# Patient Record
Sex: Female | Born: 1970 | Race: Black or African American | Hispanic: No | Marital: Single | Smoking: Former smoker
Health system: Southern US, Community
[De-identification: ages and names within clinical notes are randomized; demographics above are authoritative.]

## PROBLEM LIST (undated history)

## (undated) DIAGNOSIS — Z862 Personal history of diseases of the blood and blood-forming organs and certain disorders involving the immune mechanism: Secondary | ICD-10-CM

## (undated) DIAGNOSIS — N186 End stage renal disease: Secondary | ICD-10-CM

## (undated) DIAGNOSIS — E119 Type 2 diabetes mellitus without complications: Secondary | ICD-10-CM

## (undated) DIAGNOSIS — Z930 Tracheostomy status: Secondary | ICD-10-CM

## (undated) DIAGNOSIS — N189 Chronic kidney disease, unspecified: Secondary | ICD-10-CM

## (undated) DIAGNOSIS — I10 Essential (primary) hypertension: Secondary | ICD-10-CM

## (undated) HISTORY — PX: TRACHEOSTOMY: SUR1362

## (undated) HISTORY — PX: CHOLECYSTECTOMY: SHX55

## (undated) HISTORY — DX: Chronic kidney disease, unspecified: N18.9

## (undated) HISTORY — PX: COLONOSCOPY: SHX174

## (undated) HISTORY — PX: AV FISTULA PLACEMENT: SHX1204

## (undated) HISTORY — PX: ABDOMINAL HYSTERECTOMY: SHX81

---

## 1998-07-11 DIAGNOSIS — Z8541 Personal history of malignant neoplasm of cervix uteri: Secondary | ICD-10-CM

## 1998-07-11 HISTORY — DX: Personal history of malignant neoplasm of cervix uteri: Z85.41

## 2020-06-21 ENCOUNTER — Encounter (HOSPITAL_COMMUNITY): Payer: Self-pay | Admitting: Internal Medicine

## 2020-06-21 ENCOUNTER — Other Ambulatory Visit: Payer: Self-pay

## 2020-06-21 ENCOUNTER — Emergency Department (HOSPITAL_COMMUNITY): Payer: Medicaid Other

## 2020-06-21 ENCOUNTER — Inpatient Hospital Stay (HOSPITAL_COMMUNITY)
Admission: EM | Admit: 2020-06-21 | Discharge: 2020-06-24 | DRG: 640 | Disposition: A | Discharge status: Home / Self Care | Payer: Medicaid Other | Attending: Internal Medicine | Admitting: Internal Medicine

## 2020-06-21 DIAGNOSIS — J9601 Acute respiratory failure with hypoxia: Secondary | ICD-10-CM | POA: Diagnosis present

## 2020-06-21 DIAGNOSIS — E1122 Type 2 diabetes mellitus with diabetic chronic kidney disease: Secondary | ICD-10-CM | POA: Diagnosis present

## 2020-06-21 DIAGNOSIS — Z930 Tracheostomy status: Secondary | ICD-10-CM

## 2020-06-21 DIAGNOSIS — R0602 Shortness of breath: Secondary | ICD-10-CM

## 2020-06-21 DIAGNOSIS — Z992 Dependence on renal dialysis: Secondary | ICD-10-CM

## 2020-06-21 DIAGNOSIS — I120 Hypertensive chronic kidney disease with stage 5 chronic kidney disease or end stage renal disease: Secondary | ICD-10-CM | POA: Diagnosis present

## 2020-06-21 DIAGNOSIS — E872 Acidosis: Secondary | ICD-10-CM | POA: Diagnosis present

## 2020-06-21 DIAGNOSIS — N2581 Secondary hyperparathyroidism of renal origin: Secondary | ICD-10-CM | POA: Diagnosis present

## 2020-06-21 DIAGNOSIS — Z6841 Body Mass Index (BMI) 40.0 and over, adult: Secondary | ICD-10-CM

## 2020-06-21 DIAGNOSIS — Z87891 Personal history of nicotine dependence: Secondary | ICD-10-CM

## 2020-06-21 DIAGNOSIS — E8779 Other fluid overload: Secondary | ICD-10-CM

## 2020-06-21 DIAGNOSIS — Z20822 Contact with and (suspected) exposure to covid-19: Secondary | ICD-10-CM | POA: Diagnosis present

## 2020-06-21 DIAGNOSIS — Z8541 Personal history of malignant neoplasm of cervix uteri: Secondary | ICD-10-CM

## 2020-06-21 DIAGNOSIS — D631 Anemia in chronic kidney disease: Secondary | ICD-10-CM | POA: Diagnosis present

## 2020-06-21 DIAGNOSIS — N186 End stage renal disease: Secondary | ICD-10-CM | POA: Diagnosis not present

## 2020-06-21 DIAGNOSIS — I160 Hypertensive urgency: Secondary | ICD-10-CM | POA: Diagnosis present

## 2020-06-21 DIAGNOSIS — Z794 Long term (current) use of insulin: Secondary | ICD-10-CM

## 2020-06-21 DIAGNOSIS — Z833 Family history of diabetes mellitus: Secondary | ICD-10-CM

## 2020-06-21 DIAGNOSIS — Z5900 Homelessness unspecified: Secondary | ICD-10-CM

## 2020-06-21 DIAGNOSIS — I10 Essential (primary) hypertension: Secondary | ICD-10-CM | POA: Diagnosis present

## 2020-06-21 DIAGNOSIS — D6959 Other secondary thrombocytopenia: Secondary | ICD-10-CM | POA: Diagnosis present

## 2020-06-21 DIAGNOSIS — Z90710 Acquired absence of both cervix and uterus: Secondary | ICD-10-CM

## 2020-06-21 DIAGNOSIS — K580 Irritable bowel syndrome with diarrhea: Secondary | ICD-10-CM | POA: Diagnosis present

## 2020-06-21 DIAGNOSIS — E875 Hyperkalemia: Secondary | ICD-10-CM | POA: Diagnosis present

## 2020-06-21 DIAGNOSIS — E8770 Fluid overload, unspecified: Secondary | ICD-10-CM | POA: Diagnosis present

## 2020-06-21 DIAGNOSIS — Z9115 Patient's noncompliance with renal dialysis: Secondary | ICD-10-CM | POA: Diagnosis not present

## 2020-06-21 DIAGNOSIS — E119 Type 2 diabetes mellitus without complications: Secondary | ICD-10-CM

## 2020-06-21 DIAGNOSIS — Z91158 Patient's noncompliance with renal dialysis for other reason: Secondary | ICD-10-CM

## 2020-06-21 DIAGNOSIS — E1165 Type 2 diabetes mellitus with hyperglycemia: Secondary | ICD-10-CM | POA: Diagnosis present

## 2020-06-21 DIAGNOSIS — E785 Hyperlipidemia, unspecified: Secondary | ICD-10-CM | POA: Diagnosis present

## 2020-06-21 HISTORY — DX: End stage renal disease: N18.6

## 2020-06-21 HISTORY — DX: Type 2 diabetes mellitus without complications: E11.9

## 2020-06-21 HISTORY — DX: Personal history of diseases of the blood and blood-forming organs and certain disorders involving the immune mechanism: Z86.2

## 2020-06-21 HISTORY — DX: Tracheostomy status: Z93.0

## 2020-06-21 HISTORY — DX: Essential (primary) hypertension: I10

## 2020-06-21 LAB — CBC WITH DIFFERENTIAL/PLATELET
Abs Immature Granulocytes: 0.04 10*3/uL (ref 0.00–0.07)
Basophils Absolute: 0 10*3/uL (ref 0.0–0.1)
Basophils Relative: 1 %
Eosinophils Absolute: 0.4 10*3/uL (ref 0.0–0.5)
Eosinophils Relative: 6 %
HCT: 25.7 % — ABNORMAL LOW (ref 36.0–46.0)
Hemoglobin: 8 g/dL — ABNORMAL LOW (ref 12.0–15.0)
Immature Granulocytes: 1 %
Lymphocytes Relative: 24 %
Lymphs Abs: 1.4 10*3/uL (ref 0.7–4.0)
MCH: 28.3 pg (ref 26.0–34.0)
MCHC: 31.1 g/dL (ref 30.0–36.0)
MCV: 90.8 fL (ref 80.0–100.0)
Monocytes Absolute: 0.3 10*3/uL (ref 0.1–1.0)
Monocytes Relative: 6 %
Neutro Abs: 3.8 10*3/uL (ref 1.7–7.7)
Neutrophils Relative %: 62 %
Platelets: 102 10*3/uL — ABNORMAL LOW (ref 150–400)
RBC: 2.83 MIL/uL — ABNORMAL LOW (ref 3.87–5.11)
RDW: 16.4 % — ABNORMAL HIGH (ref 11.5–15.5)
WBC: 6 10*3/uL (ref 4.0–10.5)
nRBC: 0 % (ref 0.0–0.2)

## 2020-06-21 LAB — HEMOGLOBIN A1C
Hgb A1c MFr Bld: 10 % — ABNORMAL HIGH (ref 4.8–5.6)
Mean Plasma Glucose: 240.3 mg/dL

## 2020-06-21 LAB — BASIC METABOLIC PANEL
Anion gap: 18 — ABNORMAL HIGH (ref 5–15)
BUN: 102 mg/dL — ABNORMAL HIGH (ref 6–20)
CO2: 16 mmol/L — ABNORMAL LOW (ref 22–32)
Calcium: 6.7 mg/dL — ABNORMAL LOW (ref 8.9–10.3)
Chloride: 102 mmol/L (ref 98–111)
Creatinine, Ser: 15.29 mg/dL — ABNORMAL HIGH (ref 0.44–1.00)
GFR, Estimated: 3 mL/min — ABNORMAL LOW (ref >60)
Glucose, Bld: 206 mg/dL — ABNORMAL HIGH (ref 70–99)
Potassium: 5.9 mmol/L — ABNORMAL HIGH (ref 3.5–5.1)
Sodium: 136 mmol/L (ref 135–145)

## 2020-06-21 LAB — GLUCOSE, CAPILLARY
Glucose-Capillary: 148 mg/dL — ABNORMAL HIGH (ref 70–99)
Glucose-Capillary: 183 mg/dL — ABNORMAL HIGH (ref 70–99)

## 2020-06-21 LAB — CBC
HCT: 25.9 % — ABNORMAL LOW (ref 36.0–46.0)
Hemoglobin: 7.9 g/dL — ABNORMAL LOW (ref 12.0–15.0)
MCH: 27.8 pg (ref 26.0–34.0)
MCHC: 30.5 g/dL (ref 30.0–36.0)
MCV: 91.2 fL (ref 80.0–100.0)
Platelets: 99 10*3/uL — ABNORMAL LOW (ref 150–400)
RBC: 2.84 MIL/uL — ABNORMAL LOW (ref 3.87–5.11)
RDW: 16.6 % — ABNORMAL HIGH (ref 11.5–15.5)
WBC: 6.3 10*3/uL (ref 4.0–10.5)
nRBC: 0 % (ref 0.0–0.2)

## 2020-06-21 LAB — MRSA PCR SCREENING: MRSA by PCR: POSITIVE — AB

## 2020-06-21 LAB — RESP PANEL BY RT-PCR (FLU A&B, COVID) ARPGX2
Influenza A by PCR: NEGATIVE
Influenza B by PCR: NEGATIVE
SARS Coronavirus 2 by RT PCR: NEGATIVE

## 2020-06-21 LAB — MAGNESIUM: Magnesium: 2.2 mg/dL (ref 1.7–2.4)

## 2020-06-21 LAB — CBG MONITORING, ED
Glucose-Capillary: 156 mg/dL — ABNORMAL HIGH (ref 70–99)
Glucose-Capillary: 158 mg/dL — ABNORMAL HIGH (ref 70–99)

## 2020-06-21 LAB — TROPONIN I (HIGH SENSITIVITY)
Troponin I (High Sensitivity): 17 ng/L (ref <18)
Troponin I (High Sensitivity): 17 ng/L (ref <18)

## 2020-06-21 MED ORDER — LIDOCAINE HCL (PF) 1 % IJ SOLN: 5.0000 mL | INTRAMUSCULAR | Status: DC | PRN

## 2020-06-21 MED ORDER — PENTAFLUOROPROP-TETRAFLUOROETH EX AERO: 1.0000 "application " | INHALATION_SPRAY | CUTANEOUS | Status: DC | PRN

## 2020-06-21 MED ORDER — ACETAMINOPHEN 325 MG PO TABS
650.0000 mg | ORAL_TABLET | Freq: Four times a day (QID) | ORAL | Status: DC | PRN
  Administered 2020-06-22 (×2): 650 mg via ORAL
  Filled 2020-06-21 (×2): qty 2

## 2020-06-21 MED ORDER — HYDRALAZINE HCL 25 MG PO TABS
25.0000 mg | ORAL_TABLET | Freq: Three times a day (TID) | ORAL | Status: DC
  Administered 2020-06-21 – 2020-06-24 (×7): 25 mg via ORAL
  Filled 2020-06-21 (×10): qty 1

## 2020-06-21 MED ORDER — CINACALCET HCL 30 MG PO TABS: 30.0000 mg | ORAL_TABLET | Freq: Every day | ORAL | Status: DC

## 2020-06-21 MED ORDER — HYDRALAZINE HCL 20 MG/ML IJ SOLN
10.0000 mg | Freq: Once | INTRAMUSCULAR | Status: AC
  Administered 2020-06-21: 03:00:00 10 mg via INTRAVENOUS
  Filled 2020-06-21: qty 1

## 2020-06-21 MED ORDER — SEVELAMER CARBONATE 800 MG PO TABS
2400.0000 mg | ORAL_TABLET | Freq: Three times a day (TID) | ORAL | Status: DC
  Administered 2020-06-21 – 2020-06-24 (×10): 2400 mg via ORAL
  Filled 2020-06-21 (×11): qty 3

## 2020-06-21 MED ORDER — CHLORHEXIDINE GLUCONATE CLOTH 2 % EX PADS
6.0000 | MEDICATED_PAD | Freq: Every day | CUTANEOUS | Status: DC
  Administered 2020-06-21 – 2020-06-24 (×3): 6 via TOPICAL

## 2020-06-21 MED ORDER — AMLODIPINE BESYLATE 10 MG PO TABS
10.0000 mg | ORAL_TABLET | Freq: Every day | ORAL | Status: DC
  Administered 2020-06-22 – 2020-06-24 (×3): 10 mg via ORAL
  Filled 2020-06-21 (×3): qty 1

## 2020-06-21 MED ORDER — HEPARIN SODIUM (PORCINE) 1000 UNIT/ML DIALYSIS: 1000.0000 [IU] | INTRAMUSCULAR | Status: DC | PRN

## 2020-06-21 MED ORDER — ACETAMINOPHEN 650 MG RE SUPP: 650.0000 mg | Freq: Four times a day (QID) | RECTAL | Status: DC | PRN

## 2020-06-21 MED ORDER — LABETALOL HCL 200 MG PO TABS
300.0000 mg | ORAL_TABLET | Freq: Two times a day (BID) | ORAL | Status: DC
  Administered 2020-06-22 – 2020-06-24 (×6): 300 mg via ORAL
  Filled 2020-06-21 (×6): qty 1

## 2020-06-21 MED ORDER — INSULIN DETEMIR 100 UNIT/ML ~~LOC~~ SOLN
15.0000 [IU] | Freq: Every day | SUBCUTANEOUS | Status: DC
  Administered 2020-06-22 – 2020-06-24 (×4): 15 [IU] via SUBCUTANEOUS
  Filled 2020-06-21 (×4): qty 0.15

## 2020-06-21 MED ORDER — SODIUM CHLORIDE 0.9 % IV SOLN: 100.0000 mL | INTRAVENOUS | Status: DC | PRN

## 2020-06-21 MED ORDER — SENNOSIDES-DOCUSATE SODIUM 8.6-50 MG PO TABS: 1.0000 | ORAL_TABLET | Freq: Every evening | ORAL | Status: DC | PRN

## 2020-06-21 MED ORDER — LIDOCAINE-PRILOCAINE 2.5-2.5 % EX CREA: 1.0000 "application " | TOPICAL_CREAM | CUTANEOUS | Status: DC | PRN

## 2020-06-21 MED ORDER — ONDANSETRON HCL 4 MG PO TABS
4.0000 mg | ORAL_TABLET | Freq: Four times a day (QID) | ORAL | Status: DC | PRN
  Administered 2020-06-22: 4 mg via ORAL
  Filled 2020-06-21: qty 1

## 2020-06-21 MED ORDER — INSULIN ASPART 100 UNIT/ML ~~LOC~~ SOLN
0.0000 [IU] | Freq: Three times a day (TID) | SUBCUTANEOUS | Status: DC
  Administered 2020-06-21: 1 [IU] via SUBCUTANEOUS
  Administered 2020-06-21: 09:00:00 2 [IU] via SUBCUTANEOUS
  Administered 2020-06-22 (×2): 1 [IU] via SUBCUTANEOUS
  Administered 2020-06-22 – 2020-06-23 (×2): 2 [IU] via SUBCUTANEOUS
  Administered 2020-06-24: 3 [IU] via SUBCUTANEOUS
  Administered 2020-06-24: 2 [IU] via SUBCUTANEOUS
  Administered 2020-06-24: 3 [IU] via SUBCUTANEOUS

## 2020-06-21 MED ORDER — SODIUM ZIRCONIUM CYCLOSILICATE 10 G PO PACK
10.0000 g | PACK | Freq: Once | ORAL | Status: AC
  Administered 2020-06-21: 05:00:00 10 g via ORAL
  Filled 2020-06-21: qty 1

## 2020-06-21 MED ORDER — HEPARIN SODIUM (PORCINE) 5000 UNIT/ML IJ SOLN
5000.0000 [IU] | Freq: Three times a day (TID) | INTRAMUSCULAR | Status: DC
  Administered 2020-06-21 – 2020-06-24 (×10): 5000 [IU] via SUBCUTANEOUS
  Filled 2020-06-21 (×10): qty 1

## 2020-06-21 MED ORDER — FENTANYL CITRATE (PF) 100 MCG/2ML IJ SOLN
50.0000 ug | Freq: Once | INTRAMUSCULAR | Status: AC
  Administered 2020-06-21: 02:00:00 50 ug via INTRAVENOUS
  Filled 2020-06-21: qty 2

## 2020-06-21 MED ORDER — ROSUVASTATIN CALCIUM 5 MG PO TABS
5.0000 mg | ORAL_TABLET | Freq: Every day | ORAL | Status: DC
  Administered 2020-06-22 – 2020-06-24 (×4): 5 mg via ORAL
  Filled 2020-06-21 (×4): qty 1

## 2020-06-21 MED ORDER — ONDANSETRON HCL 4 MG/2ML IJ SOLN: 4.0000 mg | Freq: Four times a day (QID) | INTRAMUSCULAR | Status: DC | PRN

## 2020-06-21 MED ORDER — ALTEPLASE 2 MG IJ SOLR: 2.0000 mg | Freq: Once | INTRAMUSCULAR | Status: DC | PRN

## 2020-06-21 MED ORDER — HYDRALAZINE HCL 20 MG/ML IJ SOLN
10.0000 mg | Freq: Once | INTRAMUSCULAR | Status: AC
  Administered 2020-06-21: 04:00:00 10 mg via INTRAVENOUS
  Filled 2020-06-21: qty 1

## 2020-06-21 NOTE — H&P (Addendum)
History and Physical    Pamela Rogers ZOX:[id number] DOB: [birth year] DOA: 06/21/2020  PCP: Patient, No Pcp Per  Patient coming from: Home  I have personally briefly reviewed patient's old medical records in [place]  Chief Complaint: SOB  HPI: Pamela Rogers is a 49 y.o. 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  Patient presents to ED with multiple missed sessions of dialysis.  Last dialyzed earlier this month, states unable to go since then due to lack of transportation.  Pt having progressively worsening SOB over past week and a half since last dialysis.  Dry cough.  In town visiting daughter in [place].  Looks like she has history of missing dialysis sessions somewhat chronically: recently admitted to [facility] a mere 10 days ago (12/12-12/14) for missed dialysis, HCAP due to pan-sensitive pseudomonas.  Hospitalist at [facility] notes that shes had 11 admissions to [facility] since 02/2019 for missed dialysis sessions.  Pt denies fevers, chills, CP.   ED Course: initial BP 219/119, RR 30, HGB 8.0, Creat 15 BUN 102, calcium 6.7.  CXR shows vascular congestion, and cardiomegally.   Review of Systems: As per HPI, otherwise all review of systems negative.  Past Medical History:  Diagnosis Date  . DM2 (diabetes mellitus, type 2) ([place])   . ESRD (end stage renal disease) ([place])    dialysis MWF  . History of anemia due to CKD   . History of cervical cancer 2000  . HTN (hypertension)   . Tracheostomy in place [facility])    Chronic    Past Surgical History:  Procedure Laterality Date  . ABDOMINAL HYSTERECTOMY    . AV FISTULA PLACEMENT    . CHOLECYSTECTOMY    . COLONOSCOPY    . TRACHEOSTOMY       reports that she has quit smoking. Her smoking use included cigarettes. She has a 31.00 pack-year smoking history. She does not have any smokeless tobacco history on file. She reports previous alcohol use. She reports that she does not  use drugs.  Allergies  Allergen Reactions  . Hydrocodone   . Morphine And Related   . Penicillins     Family History  Problem Relation Age of Onset  . Diabetes Father   . Pancreatitis Father   . Liver disease Father   . Lung cancer Father   . Learning disabilities Son   . Asthma Paternal Aunt   . Asthma Paternal Uncle   . Colon cancer Neg Hx   . Stomach cancer Neg Hx      Prior to Admission medications   Medication Sig Start Date End Date Taking? Authorizing Provider  insulin detemir (LEVEMIR) 100 UNIT/ML injection Inject 30 Units into the skin at bedtime.   Yes [provider]  insulin aspart (NOVOLOG) 100 UNIT/ML injection Inject into the skin 3 (three) times daily before meals.    [provider]    Physical Exam: Vitals:   06/21/20 0445 06/21/20 0500 06/21/20 0515 06/21/20 0530  BP: (!) 155/73 (!) 144/62 (!) 162/78 (!) 153/97  Pulse: 86 83 88 87  Resp: 20 (!) 22 20 (!) 22  Temp:      TempSrc:      SpO2: 98%  100% 100%  Weight:      Height:        Constitutional: NAD, calm, comfortable Eyes: PERRL, lids and conjunctivae normal ENMT: Mucous membranes are moist. Posterior pharynx clear of any exudate or lesions.Normal dentition.  Neck: normal, supple, no masses, no thyromegaly Respiratory: Mild increased WOB and tachypnea.  Bibasilar rales. Cardiovascular: Regular rate and rhythm, no murmurs / rubs / gallops. No extremity edema. 2+ pedal pulses. No carotid bruits.  Abdomen: no tenderness, no masses palpated. No hepatosplenomegaly. Bowel sounds positive.  Musculoskeletal: no clubbing / cyanosis. No joint deformity upper and lower extremities. Good ROM, no contractures. Normal muscle tone.  Skin: no rashes, lesions, ulcers. No induration Neurologic: CN 2-12 grossly intact. Sensation intact, DTR normal. Strength 5/5 in all 4.  Psychiatric: Normal judgment and insight. Alert and oriented x 3. Normal mood.    Labs on Admission: I have personally  reviewed following labs and imaging studies  CBC: Recent Labs  Lab 06/21/20 0225  WBC 6.0  NEUTROABS 3.8  HGB 8.0*  HCT 25.7*  MCV 90.8  PLT 621*   Basic Metabolic Panel: Recent Labs  Lab 06/21/20 0225  NA 136  K 5.9*  CL 102  CO2 16*  GLUCOSE 206*  BUN 102*  CREATININE 15.29*  CALCIUM 6.7*  MG 2.2   GFR: Estimated Creatinine Clearance: 5.8 mL/min (A) (by C-G formula based on SCr of 15.29 mg/dL (H)). Liver Function Tests: No results for input(s): AST, ALT, ALKPHOS, BILITOT, PROT, ALBUMIN in the last 168 hours. No results for input(s): LIPASE, AMYLASE in the last 168 hours. No results for input(s): AMMONIA in the last 168 hours. Coagulation Profile: No results for input(s): INR, PROTIME in the last 168 hours. Cardiac Enzymes: No results for input(s): CKTOTAL, CKMB, CKMBINDEX, TROPONINI in the last 168 hours. BNP (last 3 results) No results for input(s): PROBNP in the last 8760 hours. HbA1C: No results for input(s): HGBA1C in the last 72 hours. CBG: No results for input(s): GLUCAP in the last 168 hours. Lipid Profile: No results for input(s): CHOL, HDL, LDLCALC, TRIG, CHOLHDL, LDLDIRECT in the last 72 hours. Thyroid Function Tests: No results for input(s): TSH, T4TOTAL, FREET4, T3FREE, THYROIDAB in the last 72 hours. Anemia Panel: No results for input(s): VITAMINB12, FOLATE, FERRITIN, TIBC, IRON, RETICCTPCT in the last 72 hours. Urine analysis: No results found for: COLORURINE, APPEARANCEUR, LABSPEC, [place], GLUCOSEU, HGBUR, BILIRUBINUR, KETONESUR, PROTEINUR, UROBILINOGEN, NITRITE, LEUKOCYTESUR  Radiological Exams on Admission: DG Chest Portable 1 View  Result Date: 06/21/2020 CLINICAL DATA:  Shortness of breath EXAM: PORTABLE CHEST 1 VIEW COMPARISON:  None. FINDINGS: Tracheostomy tube in place. Cardiomegaly with vascular congestion. Bibasilar atelectasis. No effusions or acute bony abnormality. IMPRESSION: Cardiomegaly with vascular congestion and bibasilar  atelectasis. Electronically Signed   By: Rolm Baptise M.D.   On: 06/21/2020 02:10    EKG: Independently reviewed.  Assessment/Plan Principal Problem:   Non-compliance with renal dialysis ([place]) Active Problems:   DM2 (diabetes mellitus, type 2) ([facility])   HTN (hypertension)   ESRD (end stage renal disease) ([facility])   Volume overload   Tracheostomy status ([place])    1. Volume overload due to missed dialysis - 1. Getting lokelma for K 5.9 2. Holding cinacalcet for hypocalcemia 3. EDP spoke with nephrology, plan for dialysis this AM 4. Tele monitor 2. ESRD - 1. Cont renvela 3. HTN - 1. Cont hydralazine, amlodipine, and labetalol 2. Obtained dosing info from discharge summary x8 days ago from [place] on care everywhere. 4. DM2 - 1. As per DC summary: 2. Levemir 15 QHS 3. Sensitive SSI AC 5. Tracheostomy status - 1. Tracheostomy care as per order set 6. HLD - 1. Cont crestor 5 as per DC summary 7. Anemia of CKD - 1. HGB  of 8.0 today, was 8.5 on 12/3 2. Defer erythropoetin treatment to nephrology 8. Remainder of Med rec is still pending at this time.  DVT prophylaxis: Heparin [place] Code Status: Full Family Communication: No family in room Disposition Plan: Home after nephrology feels adequate dialysis done Consults called: EDP spoke with nephrology Admission status: Place in obs - convert to IP if not discharged today.   Willean Schurman Jerilynn Mages DO [facility]  How to contact the [facility] Attending or Consulting provider [place] or covering provider during after hours [place], for this patient?  1. Check the care team in [facility] and look for a) attending/consulting [facility] provider listed and b) the [facility] team listed 2. Log into [email or web address]  Amion Physician Scheduling and messaging for groups and whole hospitals  On call and physician scheduling software for group practices, residents, hospitalists and other medical providers for call, clinic, rotation and shift schedules. OnCall [employer] is a  hospital-wide system for scheduling doctors and paging doctors on call. EasyPlot is for scientific plotting and data analysis.  [email or web address]  and use [place]'s universal password to access. If you do not have the password, please contact the hospital operator.  3. Locate the [facility] provider you are looking for under [facility] and page to a number that you can be directly reached. 4. If you still have difficulty reaching the provider, please page the [facility] (Director on Call) for the Hospitalists listed on amion for assistance.  06/21/2020, 5:46 AM

## 2020-06-21 NOTE — Consult Note (Signed)
ESRD Consult Note [facility]  Requesting provider: Etta Quill, DO;Pah*  Outpatient dialysis unit: [facility] Outpatient dialysis schedule: MWF  Assessment/Recommendations:   ESRD on HD: -orders per [facility]where from nephrology at [place], F200, 400/700, 2k, 3Cal, no heparin  SOB/vascular congestion -in the setting of missed dialysis -will do a short treatment later this evening, back on schedule tomorrow again  Hyperkalemia -restrict K, s/p lokelma x 1 dose, HD today, back on sched tomorrow  Volume/ hypertension: EDW 113kg.   Anemia of Chronic Kidney Disease: Hemoglobin 8. Currently receiving aranesp 74mcg qweekly.   Secondary Hyperparathyroidism/Hyperphosphatemia: calcitriol 42mcg qtreatment   Vascular access: left rc avf  Additional recommendations: - Dose all meds for creatinine clearance < 10 ml/min  - Unless absolutely necessary, no MRIs with gadolinium.  - Implement save arm precautions.  Prefer needle sticks in the dorsum of the hands or wrists.  No blood pressure measurements in arm. - If blood transfusion is requested during hemodialysis sessions, please alert [place] prior to the session.  - If a hemodialysis catheter line culture is requested, please alert [place] as only hemodialysis nurses are able to collect those specimens.   Recommendations were discussed with the primary team.  Gean Quint, MD [facility]  History of Present Illness: Pamela Rogers is a/an 49 y.o. female with a past medical history of ESRD on HD, medical and HD noncompliance with frequent hospitalizations/admissions at [place], HTN, HLD, T2DM, chronic anemia, chronictracheostomy (placed 11/2018), morbid obesity, IBS, GERD, OSA w/ obesity hypoventilation, Hx cervical CA, Hx pancreatitis, anxiety/depression who presents with SOB. Typically dialyzed at [facility], has been here in [place] for over a week visiting her daughter. No dialysis in the interim. She reports feeling  better as compared to yesterday. Does endorse nausea which occurs after missing several treatments. Denies any vomiting, chest pain, worsening swelling. She reports that she does not make any urine.  Medications:  Current Facility-Administered Medications  Medication Dose Route Frequency Provider Last Rate Last Admin  . acetaminophen (TYLENOL) tablet 650 mg  650 mg Oral Q6H PRN Etta Quill, DO       Or  . acetaminophen (TYLENOL) suppository 650 mg  650 mg Rectal Q6H PRN Etta Quill, DO      . amLODipine (NORVASC) tablet 10 mg  10 mg Oral Daily Jennette Kettle M, DO      . heparin injection 5,000 Units  5,000 Units Subcutaneous Q8H Etta Quill, DO   5,000 Units at 06/21/20 [phone]  . hydrALAZINE (APRESOLINE) tablet 25 mg  25 mg Oral Q8H Jennette Kettle M, DO   25 mg at 06/21/20 2426  . insulin aspart (novoLOG) injection 0-9 Units  0-9 Units Subcutaneous TID WC Alcario Drought, Jared M, DO      . insulin detemir (LEVEMIR) injection 15 Units  15 Units Subcutaneous QHS Jennette Kettle M, DO      . labetalol (NORMODYNE) tablet 300 mg  300 mg Oral BID Jennette Kettle M, DO      . ondansetron [facility]) tablet 4 mg  4 mg Oral Q6H PRN Etta Quill, DO       Or  . ondansetron [facility]) injection 4 mg  4 mg Intravenous Q6H PRN Etta Quill, DO      . rosuvastatin (CRESTOR) tablet 5 mg  5 mg Oral QHS Jennette Kettle M, DO      . senna-docusate (Senokot-S) tablet 1 tablet  1 tablet Oral QHS PRN Etta Quill, DO      .  sevelamer carbonate (RENVELA) tablet 2,400 mg  2,400 mg Oral TID WC Etta Quill, DO       Current Outpatient Medications  Medication Sig Dispense Refill  . insulin detemir (LEVEMIR) 100 UNIT/ML injection Inject 30 Units into the skin at bedtime.    . insulin aspart (NOVOLOG) 100 UNIT/ML injection Inject into the skin 3 (three) times daily before meals.       ALLERGIES Hydrocodone, Morphine and related, and Penicillins  MEDICAL HISTORY Past Medical History:  Diagnosis  Date  . DM2 (diabetes mellitus, type 2) ([place])   . ESRD (end stage renal disease) ([place])    dialysis MWF  . History of anemia due to CKD   . History of cervical cancer 2000  . HTN (hypertension)   . Tracheostomy in place [facility])    Chronic     SOCIAL HISTORY Social History   Socioeconomic History  . Marital status: Single    Spouse name: Not on file  . Number of children: Not on file  . Years of education: Not on file  . Highest education level: Not on file  Occupational History  . Not on file  Tobacco Use  . Smoking status: Former Smoker    Packs/day: 1.00    Years: 31.00    Pack years: 31.00    Types: Cigarettes  . Smokeless tobacco: Not on file  Substance and Sexual Activity  . Alcohol use: Not Currently    Comment: last in year 2000  . Drug use: Never  . Sexual activity: Not on file  Other Topics Concern  . Not on file  Social History Narrative  . Not on file   Social Determinants of Health   Financial Resource Strain: Not on file  Food Insecurity: Not on file  Transportation Needs: Not on file  Physical Activity: Not on file  Stress: Not on file  Social Connections: Not on file  Intimate Partner Violence: Not on file     FAMILY HISTORY Family History  Problem Relation Age of Onset  . Diabetes Father   . Pancreatitis Father   . Liver disease Father   . Lung cancer Father   . Learning disabilities Son   . Asthma Paternal Aunt   . Asthma Paternal Uncle   . Colon cancer Neg Hx   . Stomach cancer Neg Hx      Review of Systems: 12 systems were reviewed and negative except per HPI  Physical Exam: Vitals:   06/21/20 0616 06/21/20 0645  BP: (!) 164/92 (!) 144/91  Pulse:    Resp:    Temp:    SpO2:     No intake/output data recorded. No intake or output data in the 24 hours ending 06/21/20 0656 General: well-appearing, no acute distress HEENT: anicteric sclera, MMM, trach inplace CV: normal rate, no murmurs Lungs: poor air exchange diffuse,  trach, no overt w/r/r/c Abd: soft, non-tender, non-distended MSK: trace edema b/l LE's Skin: no visible lesions or rashes Neuro: no gross focal deficits  Access: lue rc avf +b/t  Test Results Reviewed Lab Results  Component Value Date   NA 136 06/21/2020   K 5.9 (H) 06/21/2020   CL 102 06/21/2020   CO2 16 (L) 06/21/2020   BUN 102 (H) 06/21/2020   CREATININE 15.29 (H) 06/21/2020   CALCIUM 6.7 (L) 06/21/2020    I have reviewed relevant outside healthcare records

## 2020-06-21 NOTE — ED Notes (Signed)
Spoke with Maudie Mercury in patient placement and made her aware that patient has a trach.

## 2020-06-21 NOTE — ED Notes (Signed)
Ordered Breakfast 

## 2020-06-21 NOTE — Progress Notes (Addendum)
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  Last dialyzed earlier this month.  Mention that unable to go for dialysis due to lack of transportation.  Presented with worsening shortness of breath and dry cough since 1 week.  Recently admitted at [facility] for missed dialysis, HCAP due to pansensitive Pseudomonas.  Hospitalized 11 times since 02/2019 for missed dialysis sessions.  Upon arrival to ED: Blood pressure noted to be elevated at 219/119, respiratory rate: 30, hemoglobin: 8.0, creatinine: 15, BUN: 102, calcium: 6.7.  Potassium: 5.9 chest x-ray shows vascular congestion and cardiomegaly.  Nephrology consulted for dialysis.  Patient admitted for fluid overload in the setting of missed hemodialysis appointments.  Assessment & Plan:  Volume overload due to missed dialysis: -Patient presented with worsening shortness of breath and elevated blood pressure -History of noncompliance with dialysis and multiple hospitalization at [facility]. -Blood pressure elevated upon arrival.  Remained afebrile.  Chest x-ray shows vascular congestion and cardiomegaly.  COVID-19 negative.  Troponin x2 -. -Nephrology consulted for hemodialysis this morning. -Strict INO's and daily weight. -Monitor vitals closely  Hyperkalemia: Potassium 5.9 upon admission. -Secondary to missed hemodialysis.  Lokelma x1 given. -EKG: No acute changes. -Repeat BMP  Hypertension urgency: -In the setting of volume overload due to missed dialysis -Continue home meds of hydralazine, amlodipine and  labetalol -Blood pressure is improving.  Continue to monitor  High anion gap metabolic acidosis: -In the setting of ESRD.  Anion gap: 18, bicarb: 16. -Management as per nephrology.  Uncontrolled type 2 diabetes mellitus: -A1c: 10.0%.  Unsure about patient's compliance.  Continue on sensitive sliding scale insulin and Levemir 15 nightly.  Hyperlipidemia: Continue statin  Anemia of chronic disease: -In the setting of ESRD.  H&H 8.0/25.7 which is around her baseline.  Receiving Anasept 75 mcg q. weekly. -Monitor H&H closely.  History of tracheostomy: -Continue tracheostomy care as per order set.  Morbid obesity with BMI of 53: -Diet modification/exercise and weight loss  Thrombocytopenia: Platelet: 102. -No signs of active bleeding.  Repeat CBC tomorrow AM.  DVT prophylaxis: Heparin/SCD Code Status: Full code Family Communication:  None present at bedside.  Plan of care discussed with patient in length and she verbalized understanding and agreed with it. Disposition Plan: Home in 1 to 2 days  Consultants:   Nephrology  Procedures:   Dialysis  Antimicrobials:   None  Status is: Observation   Dispo: The patient is from: Home              Anticipated d/c is to: Home              Anticipated d/c date is: 1 day              Patient currently is not medically stable to d/c.   Subjective: Patient seen and examined in the ED.  Has trach.  Sleepy but arousable and answers appropriately.  Reports shortness of breath however denies chest pain, leg swelling, fever, chills, nausea or vomiting.  Objective: Vitals:   06/21/20 0616 06/21/20 0645 06/21/20 0737 06/21/20 0900  BP: (!) 164/92 (!) 144/91  (!) 159/87  Pulse:  80    Resp:  20    Temp:   97.9 F (36.6 C)   TempSrc:   Oral   SpO2:  97%    Weight:      Height:       No intake or output data in the 24 hours ending 06/21/20 0934 Filed Weights   06/21/20 0125  Weight: 131.5 kg    Examination:  General exam:  Appears calm and comfortable, has trach, obese, sleepy but arousable and communicating well Respiratory system: Clear to auscultation. Respiratory effort normal. Cardiovascular system: S1 & S2 heard, RRR. No JVD, murmurs, rubs, gallops or clicks. No pedal edema. Gastrointestinal system: Abdomen is nondistended, soft and nontender. No organomegaly or masses felt. Normal bowel sounds heard. Central nervous system: Alert and oriented. No focal neurological deficits. Extremities: Symmetric 5 x 5 power. Skin: No rashes, lesions or ulcers Psychiatry: Judgement and insight appear normal. Mood & affect appropriate.    Data Reviewed: I have personally reviewed following labs and imaging studies  CBC: Recent Labs  Lab 06/21/20 0225  WBC 6.0  NEUTROABS 3.8  HGB 8.0*  HCT 25.7*  MCV 90.8  PLT 195*   Basic Metabolic Panel: Recent Labs  Lab 06/21/20 0225  NA 136  K 5.9*  CL 102  CO2 16*  GLUCOSE 206*  BUN 102*  CREATININE 15.29*  CALCIUM 6.7*  MG 2.2   GFR: Estimated Creatinine Clearance: 5.8 mL/min (A) (by C-G formula based on SCr of 15.29 mg/dL (H)). Liver Function Tests: No results for input(s): AST, ALT, ALKPHOS, BILITOT, PROT, ALBUMIN in the last 168 hours. No results for input(s): LIPASE, AMYLASE in the last 168 hours. No results for input(s): AMMONIA in the last 168 hours. Coagulation Profile: No results for input(s): INR, PROTIME in the last 168 hours. Cardiac Enzymes: No results for input(s): CKTOTAL, CKMB, CKMBINDEX, TROPONINI in the last 168 hours. BNP (last 3 results) No results for input(s): PROBNP in the last 8760 hours. HbA1C: Recent Labs    06/21/20 0736  HGBA1C 10.0*   CBG: Recent Labs  Lab 06/21/20 0802  GLUCAP 156*   Lipid Profile: No results for input(s): CHOL, HDL, LDLCALC, TRIG, CHOLHDL, LDLDIRECT in the last 72 hours. Thyroid Function Tests: No results for input(s): TSH, T4TOTAL, FREET4, T3FREE, THYROIDAB in the last 72 hours. Anemia  Panel: No results for input(s): VITAMINB12, FOLATE, FERRITIN, TIBC, IRON, RETICCTPCT in the last 72 hours. Sepsis Labs: No results for input(s): PROCALCITON, LATICACIDVEN in the last 168 hours.  Recent Results (from the past 240 hour(s))  Resp Panel by RT-PCR (Flu A&B, Covid) Nasopharyngeal Swab     Status: None   Collection Time: 06/21/20  1:35 AM   Specimen: Nasopharyngeal Swab; Nasopharyngeal(NP) swabs in vial transport medium  Result Value Ref Range Status   SARS Coronavirus 2 by RT PCR NEGATIVE NEGATIVE Final    Comment: (NOTE) SARS-CoV-2 target nucleic acids are NOT DETECTED.  The SARS-CoV-2 RNA is generally detectable in upper respiratory specimens during the acute phase of infection. The lowest concentration of SARS-CoV-2 viral copies this assay can detect is 138 copies/mL. A negative result does not preclude SARS-Cov-2 infection and should not be used as the sole basis for treatment or other patient management decisions. A negative result may occur with  improper specimen collection/handling, submission of specimen other than nasopharyngeal swab, presence of viral mutation(s) within the areas targeted by this assay, and inadequate number of viral copies(<138 copies/mL). A negative result must be combined with clinical observations, patient history, and epidemiological information. The  expected result is Negative.  Fact Sheet for Patients:  EntrepreneurPulse.com.au  Fact Sheet for Healthcare Providers:  IncredibleEmployment.be  This test is no t yet approved or cleared by the [place] FDA and  has been authorized for detection and/or diagnosis of SARS-CoV-2 by FDA under an Emergency Use Authorization (EUA). This EUA will remain  in effect (meaning this test can be used) for the duration of the COVID-19 declaration under Section 564(b)(1) of the Act, 21 U.S.C.section 360bbb-3(b)(1), unless the authorization is terminated  or  revoked sooner.       Influenza A by PCR NEGATIVE NEGATIVE Final   Influenza B by PCR NEGATIVE NEGATIVE Final    Comment: (NOTE) The Xpert Xpress SARS-CoV-2/FLU/RSV plus assay is intended as an aid in the diagnosis of influenza from Nasopharyngeal swab specimens and should not be used as a sole basis for treatment. Nasal washings and aspirates are unacceptable for Xpert Xpress SARS-CoV-2/FLU/RSV testing.  Fact Sheet for Patients: EntrepreneurPulse.com.au  Fact Sheet for Healthcare Providers: IncredibleEmployment.be  This test is not yet approved or cleared by the [place] FDA and has been authorized for detection and/or diagnosis of SARS-CoV-2 by FDA under an Emergency Use Authorization (EUA). This EUA will remain in effect (meaning this test can be used) for the duration of the COVID-19 declaration under Section 564(b)(1) of the Act, 21 U.S.C. section 360bbb-3(b)(1), unless the authorization is terminated or revoked.  Performed at [facility], [place] [address], [place],  [address]       Radiology Studies: DG Chest Portable 1 View  Result Date: 06/21/2020 CLINICAL DATA:  Shortness of breath EXAM: PORTABLE CHEST 1 VIEW COMPARISON:  None. FINDINGS: Tracheostomy tube in place. Cardiomegaly with vascular congestion. Bibasilar atelectasis. No effusions or acute bony abnormality. IMPRESSION: Cardiomegaly with vascular congestion and bibasilar atelectasis. Electronically Signed   By: Rolm Baptise M.D.   On: 06/21/2020 02:10    Scheduled Meds: . amLODipine  10 mg Oral Daily  . Chlorhexidine Gluconate Cloth  6 each Topical Q0600  . heparin  5,000 Units Subcutaneous Q8H  . hydrALAZINE  25 mg Oral Q8H  . insulin aspart  0-9 Units Subcutaneous TID WC  . insulin detemir  15 Units Subcutaneous QHS  . labetalol  300 mg Oral BID  . rosuvastatin  5 mg Oral QHS  . sevelamer carbonate  2,400 mg Oral TID WC   Continuous Infusions:    LOS: 0 days   Time spent: 40 minutes   Kimmy Totten Loann Quill, MD [facility]  If 7PM-7AM, please contact night-coverage [email or web address] 06/21/2020, 9:34 AM

## 2020-06-21 NOTE — ED Provider Notes (Addendum)
TIME SEEN: 1:27 AM  CHIEF COMPLAINT: Shortness of breath  HPI: Patient is a 49 year old female with history of end-stage renal disease on hemodialysis Monday, Wednesday and Friday who is visiting her daughter in [place] and states Pamela Rogers started feeling short of breath.  Pamela Rogers has been here for the past week and a half from [place].  Pamela Rogers has not had dialysis in that time.  Pamela Rogers complains of dry cough.  No fever.  Is having pain all over.  Pamela Rogers states Pamela Rogers cannot tell me the day that Pamela Rogers had her last dialysis session.  Patient has a trach.  States that this was due to having a "small airway".  Not on oxygen chronically.  ROS: See HPI Constitutional: no fever  Eyes: no drainage  ENT: no runny nose   Cardiovascular:   chest pain  Resp:  SOB  GI: no vomiting GU: no dysuria Integumentary: no rash  Allergy: no hives  Musculoskeletal: no leg swelling  Neurological: no slurred speech ROS otherwise negative  PAST MEDICAL HISTORY/PAST SURGICAL HISTORY:  No past medical history on file.  MEDICATIONS:  Prior to Admission medications   Not on File    ALLERGIES:  Allergies  Allergen Reactions   Hydrocodone    Morphine And Related    Penicillins     SOCIAL HISTORY:  Social History   Tobacco Use   Smoking status: Not on file   Smokeless tobacco: Not on file  Substance Use Topics   Alcohol use: Not on file    FAMILY HISTORY: No family history on file.  EXAM: BP (!) 197/92 (BP Location: Right Arm)    Pulse 85    Temp 98.3 F (36.8 C) (Oral)    Resp (!) 30    Ht 5\' 2"  (1.575 m)    Wt 131.5 kg    SpO2 95%    BMI 53.04 kg/m  CONSTITUTIONAL: Alert and oriented and responds appropriately to questions.  Chronically ill-appearing HEAD: Normocephalic EYES: Conjunctivae clear, pupils appear equal, EOM appear intact ENT: normal nose; moist mucous membranes NECK: Supple, normal ROM, trach in place without drainage or bleeding CARD: RRR; S1 and S2 appreciated; no murmurs, no clicks, no  rubs, no gallops RESP: Patient is tachypneic.  No hypoxia on room air but does have some increased work of breathing and speaking short sentences.  Bibasilar rales on exam.  No rhonchi or wheezing.  No respiratory distress. 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   MEDICAL DECISION MAKING: Patient here with complaints of chest pain, shortness of breath.  Chest pain seems very atypical.  Pamela Rogers does appear short of breath but is not hypoxic here.  Pamela Rogers is extremely hypertensive.  I suspect patient will need dialysis urgently.  EKG shows no ischemia.  Will obtain labs, chest x-ray.  Will give medication for her hypertension and for pain.  Unfortunate this time I have no access to outside hospital records.  ED PROGRESS: Patient's potassium level is 5.9 with slightly prolonged QT interval.  Magnesium level normal.  Chest x-ray shows  vascular congestion and bibasilar atelectasis.  Pamela Rogers has been placed on oxygen for comfort and her work of breathing has improved.  Her Covid test is negative.  Her troponin is 17.  Will discuss with nephrology as I feel Pamela Rogers will need dialysis.  Pamela Rogers continues to be extremely hypertensive without improvement with IV hydralazine.  4:20 AM  Spoke with Dr. Candiss Norse with nephrology.  He agrees on medical admission given hypertension and hyperkalemia.  Will give one-time dose of 10 g of Lokelma per nephrology recommendations.  He will place orders for dialysis in the  morning.  4:38 AM Discussed patient's case with hospitalist, Dr. Alcario Drought.  I have recommended admission and patient (and family if present) agree with this plan. Admitting physician will place admission orders.   I reviewed all nursing notes, vitals, pertinent previous records and reviewed/interpreted all EKGs, lab and urine results, imaging (as available).   6:20 AM  Repeat troponin flat.  Patient's blood pressures have improved.  Resting comfortably on nasal cannula.   EKG Interpretation  Date/Time:  Sunday June 21 2020 01:19:34 EST Ventricular Rate:  89 PR Interval:    QRS Duration: 94 QT Interval:  416 QTC Calculation: 507 R Axis:   3 Text Interpretation: Sinus rhythm Borderline prolonged QT interval No old tracing to compare Confirmed by Jennifer Holland, Cyril Mourning [phone]) on 06/21/2020 1:27:59 AM        CRITICAL CARE Performed by: Cyril Mourning Jeily Guthridge   Total critical care time: 45 minutes  Critical care time was exclusive of separately billable procedures and treating other patients.  Critical care was necessary to treat or prevent imminent or life-threatening deterioration.  Critical care was time spent personally by me on the following activities: development of treatment plan with patient and/or surrogate as well as nursing, discussions with consultants, evaluation of patient's response to treatment, examination of patient, obtaining history from patient or surrogate, ordering and performing treatments and interventions, ordering and review of laboratory studies, ordering and review of radiographic studies, pulse oximetry and re-evaluation of patient's condition.   Ludie Hudon was evaluated in Emergency Department on 06/21/2020 for the symptoms described in the history of present illness. Pamela Rogers was evaluated in the context of the global COVID-19 pandemic, which necessitated consideration that the patient might be at risk for infection with the SARS-CoV-2 virus that causes COVID-19.  Institutional protocols and algorithms that pertain to the evaluation of patients at risk for COVID-19 are in a state of rapid change based on information released by regulatory bodies including the CDC and federal and state organizations. These policies and algorithms were followed during the patient's care in the ED.      Klyn Kroening, Delice Bison, DO 06/21/20 0438    Elin Fenley, Delice Bison, DO 06/21/20 4270

## 2020-06-21 NOTE — ED Notes (Signed)
On call HD paged again to obtain time for treatment, spoke with HD oncall, she states pt will have HD after night RN arrives at 6, hold antihypertensives until that time.

## 2020-06-21 NOTE — ED Notes (Addendum)
Stuck pt.x3 no blood return unable to get.notifed nurse

## 2020-06-21 NOTE — ED Notes (Signed)
Very large watery light brown stool, pt assisted with rolling, barrier cream applied.

## 2020-06-21 NOTE — ED Notes (Signed)
SpO2 noted to be 88% on RA, placed pt on 2L via [place], SpO2 now 97% on same.

## 2020-06-21 NOTE — ED Triage Notes (Signed)
Patient c/o shortness of breath x 1 week. Patient has not had dialysis treatment since early Dec due to transportation issues. Patient has a trach. Wet cough noted. Spo2 92% on room air.

## 2020-06-21 NOTE — ED Notes (Signed)
HD on call paged to obtain time for HD today

## 2020-06-21 NOTE — Evaluation (Signed)
Clinical/Bedside Swallow Evaluation Patient Details  Name: Pamela Rogers MRN: [id number] Date of Birth: 08-22-1970  Today's Date: 06/21/2020 Time: SLP Start Time (ACUTE ONLY): 1201 SLP Stop Time (ACUTE ONLY): 1218 SLP Time Calculation (min) (ACUTE ONLY): 17 min  Past Medical History:  Past Medical History:  Diagnosis Date  . DM2 (diabetes mellitus, type 2) ([place])   . ESRD (end stage renal disease) ([place])    dialysis MWF  . History of anemia due to CKD   . History of cervical cancer 2000  . HTN (hypertension)   . Tracheostomy in place [facility])    Chronic   Past Surgical History:  Past Surgical History:  Procedure Laterality Date  . ABDOMINAL HYSTERECTOMY    . AV FISTULA PLACEMENT    . CHOLECYSTECTOMY    . COLONOSCOPY    . TRACHEOSTOMY     HPI:  Pt is a 49 y.o. 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 Pt was admitted with progressively worsening SOB after multiple missed sessions of dialysis. CXR 12/12: Cardiomegaly with vascular congestion and bibasilar atelectasis. Per notes in care everywhere, pt was fitted with ProTrach DualValve 01/21/19 by an outpatient SLP at [facility].   Assessment / Plan / Recommendation Clinical Impression  Pt was seen for bedside swallow evaluation. She reported that she coughs occasionally (~1x/wk) with p.o. intake but she stated that she has otherwise been tolerating p.o. intake well. Oral mechanism exam was [facility] and dentition adequate. She tolerated puree solids and individual sips of thin liquids via cup/straw without overt s/sx of aspiration. However, she exhibited coughing with mixed consistency boluses (i.e., fruit cocktail) and with consecutive swallows of thin liquids. Mastication was [facility] and no significant oral residue was noted. Considering pt's reported dyspnea, pt's symptoms may be due to an impaired ability to adequate coordinate respiration with swallowing.  A dysphagia 3 diet with thin liquids is recommended at this time. SLP will follow to assess tolerance of the recommended diet and for instrumental assessment if she remains symptomatic of aspiration. SLP Visit Diagnosis: Dysphagia, unspecified (R13.10)    Aspiration Risk  Mild aspiration risk    Diet Recommendation Dysphagia 3 (Mech soft);Thin liquid   Liquid Administration via: Cup;Straw Medication Administration: Whole meds with puree Supervision: Patient able to self feed Compensations: Slow rate;Small sips/bites Postural Changes: Seated upright at 90 degrees;Remain upright for at least 30 minutes after po intake    Other  Recommendations Oral Care Recommendations: Oral care BID   Follow up Recommendations  (TBD)      Frequency and Duration min 2x/week          Prognosis Prognosis for Safe Diet Advancement: Good      Swallow Study   General Date of Onset: 06/20/20 HPI: Pt is a 49 y.o. 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 Pt was admitted with progressively worsening SOB after multiple missed sessions of dialysis. CXR 12/12: Cardiomegaly with vascular congestion and bibasilar atelectasis. Per notes in care everywhere, pt was fitted with ProTrach DualValve 01/21/19 by an outpatient SLP at [facility]. Type of Study: Bedside Swallow Evaluation Previous Swallow Assessment: None at this facility. Diet Prior to this Study: Regular;Thin liquids Temperature Spikes Noted: No Respiratory Status: Trach;Trach Collar Trach Size and Type: #6;Uncuffed History of Recent Intubation: No Behavior/Cognition: Alert;Cooperative;Pleasant mood Oral Cavity Assessment: Within Functional Limits Oral Care Completed  by SLP: No Oral Cavity - Dentition: Adequate natural dentition Vision: Functional for self-feeding Patient Positioning: Upright in bed;Postural control adequate for testing Baseline Vocal  Quality: Hoarse Volitional Cough: Strong Volitional Swallow: Able to elicit    Oral/Motor/Sensory Function Overall Oral Motor/Sensory Function: Within functional limits   Ice Chips Ice chips: Within functional limits Presentation: Spoon   Thin Liquid Thin Liquid: Impaired Presentation: Straw;Cup Pharyngeal  Phase Impairments: Cough - Immediate    Nectar Thick Nectar Thick Liquid: Not tested   Honey Thick Honey Thick Liquid: Not tested   Puree Puree: Within functional limits Presentation: Spoon   Solid     Solid: Impaired Presentation: Spoon Pharyngeal Phase Impairments: Cough - Immediate (with dual consistencies)     Pamela Rogers, Pamela Rogers, Pamela Rogers Pamela Rogers [phone] Pamela Rogers 06/21/2020,1:26 PM

## 2020-06-21 NOTE — Progress Notes (Signed)
06/21/2020 Patient transfer from the emergency room to 2West at 1640. She is alert, oriented to person place time and situation. Patient  Feet is skin was assess did not see any excoriation, but patient continue to have diarrhea and feet is dry. She receive a CHG bath, and  place on telemetry. [facility] RN.

## 2020-06-21 NOTE — Evaluation (Signed)
   Passy-Muir Speaking Valve - Evaluation Patient Details  Name: Pamela Rogers MRN: [id number] Date of Birth: Apr 23, 1971  Today's Date: 06/21/2020 Time: 1136-1200 SLP Time Calculation (min) (ACUTE ONLY): 24 min  Past Medical History:  Past Medical History:  Diagnosis Date  . DM2 (diabetes mellitus, type 2) ([place])   . ESRD (end stage renal disease) ([place])    dialysis MWF  . History of anemia due to CKD   . History of cervical cancer 2000  . HTN (hypertension)   . Tracheostomy in place [facility])    Chronic   Past Surgical History:  Past Surgical History:  Procedure Laterality Date  . ABDOMINAL HYSTERECTOMY    . AV FISTULA PLACEMENT    . CHOLECYSTECTOMY    . COLONOSCOPY    . TRACHEOSTOMY     HPI:  Pt is a 49 y.o. 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 Pt was admitted with progressively worsening SOB after multiple missed sessions of dialysis. CXR 12/12: Cardiomegaly with vascular congestion and bibasilar atelectasis. Per notes in care everywhere, pt was fitted with ProTrach DualValve 01/21/19 by an outpatient SLP at [facility].   Assessment / Plan / Recommendation Clinical Impression  Pt was seen for PMSV evaluation. Pt reported that she had been independent with using the ProTrach DualValve up until a few months prior when she lost it. Per the pt, she previously ate with the speaking valve but has been eating without it and using finger occlusion to speak. Pt has a #6 Shiley XLT cuffless trach. She tolerated PMSV placement 42 minutes including the time of the bedside swallow evaluation. Vitals were RR 19-23, SpO2 100%, and HR 80-83 during this time. She reported dyspnea during the evaluation and exhibited some intermittent difficulty with breath support; however, speech intelligibility was adequate. It is recommended that PMSV be used during all waking hours with intermittent supervision.  Considering pt's reported dyspnea and the fact that she has not used a speaking valve for months, SLP will see pt once more to ensure tolerance of it. SLP Visit Diagnosis: Aphonia (R49.1)    SLP Assessment  Patient needs continued Speech Lanaguage Pathology Services    Follow Up Recommendations  None    Frequency and Duration min 1 x/week  1 week    PMSV Trial PMSV was placed for: 42 Able to redirect subglottic air through upper airway: Yes Able to Attain Phonation: Yes Voice Quality: Normal Able to Expectorate Secretions: No attempts Level of Secretion Expectoration with PMSV: Oral Breath Support for Phonation: Inadequate Intelligibility: Intelligible Respirations During Trial:  (19-23) SpO2 During Trial:  (100) Pulse During Trial:  (80-83) Behavior: Alert;Cooperative;Expresses self well;Good eye contact;Responsive to questions   Tracheostomy Tube       Vent Dependency  Vent Dependent: No    Cuff Deflation Trial  Deven Audi I. Hardin Negus, [place], [place] Office number [phone] Pager [phone]  Tolerated Cuff Deflation:  (uncuffed; #6 XLT)        Horton Marshall 06/21/2020, 12:52 PM

## 2020-06-21 NOTE — ED Notes (Signed)
Patient cleansed of light brown BM. Chux placed. Patient repositioned.

## 2020-06-21 NOTE — ED Notes (Signed)
Dr. Leonides Schanz made aware of patient's BP remaining elevated after hydralazine.

## 2020-06-21 NOTE — ED Notes (Signed)
Dr. Leonides Schanz made aware of delay in troponin draw due to patient being a difficult stick.

## 2020-06-22 DIAGNOSIS — Z90710 Acquired absence of both cervix and uterus: Secondary | ICD-10-CM | POA: Diagnosis not present

## 2020-06-22 DIAGNOSIS — Z20822 Contact with and (suspected) exposure to covid-19: Secondary | ICD-10-CM | POA: Diagnosis present

## 2020-06-22 DIAGNOSIS — I120 Hypertensive chronic kidney disease with stage 5 chronic kidney disease or end stage renal disease: Secondary | ICD-10-CM | POA: Diagnosis present

## 2020-06-22 DIAGNOSIS — N186 End stage renal disease: Secondary | ICD-10-CM | POA: Diagnosis present

## 2020-06-22 DIAGNOSIS — Z833 Family history of diabetes mellitus: Secondary | ICD-10-CM | POA: Diagnosis not present

## 2020-06-22 DIAGNOSIS — D6959 Other secondary thrombocytopenia: Secondary | ICD-10-CM | POA: Diagnosis present

## 2020-06-22 DIAGNOSIS — E872 Acidosis: Secondary | ICD-10-CM | POA: Diagnosis present

## 2020-06-22 DIAGNOSIS — E8770 Fluid overload, unspecified: Secondary | ICD-10-CM | POA: Diagnosis present

## 2020-06-22 DIAGNOSIS — Z6841 Body Mass Index (BMI) 40.0 and over, adult: Secondary | ICD-10-CM | POA: Diagnosis not present

## 2020-06-22 DIAGNOSIS — J9601 Acute respiratory failure with hypoxia: Secondary | ICD-10-CM | POA: Diagnosis present

## 2020-06-22 DIAGNOSIS — E875 Hyperkalemia: Secondary | ICD-10-CM | POA: Diagnosis present

## 2020-06-22 DIAGNOSIS — I160 Hypertensive urgency: Secondary | ICD-10-CM | POA: Diagnosis present

## 2020-06-22 DIAGNOSIS — N2581 Secondary hyperparathyroidism of renal origin: Secondary | ICD-10-CM | POA: Diagnosis present

## 2020-06-22 DIAGNOSIS — Z87891 Personal history of nicotine dependence: Secondary | ICD-10-CM | POA: Diagnosis not present

## 2020-06-22 DIAGNOSIS — Z8541 Personal history of malignant neoplasm of cervix uteri: Secondary | ICD-10-CM | POA: Diagnosis not present

## 2020-06-22 DIAGNOSIS — Z992 Dependence on renal dialysis: Secondary | ICD-10-CM | POA: Diagnosis not present

## 2020-06-22 DIAGNOSIS — Z9115 Patient's noncompliance with renal dialysis: Secondary | ICD-10-CM | POA: Diagnosis not present

## 2020-06-22 DIAGNOSIS — D631 Anemia in chronic kidney disease: Secondary | ICD-10-CM | POA: Diagnosis present

## 2020-06-22 DIAGNOSIS — E785 Hyperlipidemia, unspecified: Secondary | ICD-10-CM | POA: Diagnosis present

## 2020-06-22 DIAGNOSIS — E1122 Type 2 diabetes mellitus with diabetic chronic kidney disease: Secondary | ICD-10-CM | POA: Diagnosis present

## 2020-06-22 DIAGNOSIS — Z794 Long term (current) use of insulin: Secondary | ICD-10-CM | POA: Diagnosis not present

## 2020-06-22 DIAGNOSIS — Z930 Tracheostomy status: Secondary | ICD-10-CM | POA: Diagnosis not present

## 2020-06-22 LAB — RENAL FUNCTION PANEL
Albumin: 3.6 g/dL (ref 3.5–5.0)
Anion gap: 18 — ABNORMAL HIGH (ref 5–15)
BUN: 107 mg/dL — ABNORMAL HIGH (ref 6–20)
CO2: 14 mmol/L — ABNORMAL LOW (ref 22–32)
Calcium: 6.5 mg/dL — ABNORMAL LOW (ref 8.9–10.3)
Chloride: 103 mmol/L (ref 98–111)
Creatinine, Ser: 16.52 mg/dL — ABNORMAL HIGH (ref 0.44–1.00)
GFR, Estimated: 2 mL/min — ABNORMAL LOW (ref >60)
Glucose, Bld: 190 mg/dL — ABNORMAL HIGH (ref 70–99)
Phosphorus: 9.6 mg/dL — ABNORMAL HIGH (ref 2.5–4.6)
Potassium: 5.9 mmol/L — ABNORMAL HIGH (ref 3.5–5.1)
Sodium: 135 mmol/L (ref 135–145)

## 2020-06-22 LAB — BASIC METABOLIC PANEL
Anion gap: 16 — ABNORMAL HIGH (ref 5–15)
BUN: 44 mg/dL — ABNORMAL HIGH (ref 6–20)
CO2: 23 mmol/L (ref 22–32)
Calcium: 7.6 mg/dL — ABNORMAL LOW (ref 8.9–10.3)
Chloride: 96 mmol/L — ABNORMAL LOW (ref 98–111)
Creatinine, Ser: 9.09 mg/dL — ABNORMAL HIGH (ref 0.44–1.00)
GFR, Estimated: 5 mL/min — ABNORMAL LOW (ref >60)
Glucose, Bld: 150 mg/dL — ABNORMAL HIGH (ref 70–99)
Potassium: 3.5 mmol/L (ref 3.5–5.1)
Sodium: 135 mmol/L (ref 135–145)

## 2020-06-22 LAB — GLUCOSE, CAPILLARY
Glucose-Capillary: 162 mg/dL — ABNORMAL HIGH (ref 70–99)
Glucose-Capillary: 135 mg/dL — ABNORMAL HIGH (ref 70–99)
Glucose-Capillary: 148 mg/dL — ABNORMAL HIGH (ref 70–99)
Glucose-Capillary: 158 mg/dL — ABNORMAL HIGH (ref 70–99)

## 2020-06-22 LAB — CBC
HCT: 23.9 % — ABNORMAL LOW (ref 36.0–46.0)
Hemoglobin: 7.6 g/dL — ABNORMAL LOW (ref 12.0–15.0)
MCH: 27.8 pg (ref 26.0–34.0)
MCHC: 31.8 g/dL (ref 30.0–36.0)
MCV: 87.5 fL (ref 80.0–100.0)
Platelets: 102 10*3/uL — ABNORMAL LOW (ref 150–400)
RBC: 2.73 MIL/uL — ABNORMAL LOW (ref 3.87–5.11)
RDW: 16.2 % — ABNORMAL HIGH (ref 11.5–15.5)
WBC: 5.2 10*3/uL (ref 4.0–10.5)
nRBC: 0 % (ref 0.0–0.2)

## 2020-06-22 LAB — HEPATITIS B SURFACE ANTIGEN: Hepatitis B Surface Ag: NONREACTIVE

## 2020-06-22 LAB — C DIFFICILE QUICK SCREEN W PCR REFLEX
C Diff antigen: NEGATIVE
C Diff interpretation: NOT DETECTED
C Diff toxin: NEGATIVE

## 2020-06-22 LAB — HIV ANTIBODY (ROUTINE TESTING W REFLEX): HIV Screen 4th Generation wRfx: NONREACTIVE

## 2020-06-22 MED ORDER — LOPERAMIDE HCL 2 MG PO CAPS
2.0000 mg | ORAL_CAPSULE | ORAL | Status: DC | PRN
  Administered 2020-06-22: 2 mg via ORAL
  Filled 2020-06-22: qty 1

## 2020-06-22 MED ORDER — GUAIFENESIN-DM 100-10 MG/5ML PO SYRP
5.0000 mL | ORAL_SOLUTION | ORAL | Status: DC | PRN
  Administered 2020-06-22: 5 mL via ORAL
  Filled 2020-06-22: qty 5

## 2020-06-22 MED ORDER — OXYCODONE HCL 5 MG PO TABS
10.0000 mg | ORAL_TABLET | ORAL | Status: DC | PRN
  Administered 2020-06-22 – 2020-06-24 (×2): 10 mg via ORAL
  Filled 2020-06-22 (×2): qty 2

## 2020-06-22 MED ORDER — MUPIROCIN 2 % EX OINT
1.0000 "application " | TOPICAL_OINTMENT | Freq: Two times a day (BID) | CUTANEOUS | Status: DC
  Administered 2020-06-22 – 2020-06-24 (×4): 1 via NASAL
  Filled 2020-06-22 (×2): qty 22

## 2020-06-22 NOTE — Progress Notes (Signed)
Round on patient secondary to missed HD and non adherence to medication regimen. Patient reports that she has difficult living arrangements since her daughter lives in [place] but her outpatient center is in [place]. Making it difficult for her to get back and forth to treatment. Patient also reports that she does not take her binders because they are too big. Patient given handouts on low phosphorus foods and educated on the significance this will have on her bones. Patient verbalizes understanding. Will reach out to CSW tomorrow for follow-up.  Mady Gemma, BSN, RN 3 Dialysis Nurse Coordinator

## 2020-06-22 NOTE — Progress Notes (Signed)
Patient HD treatment has been moved to 06/23/20;Gabrielle Kiger, RN called and notified.

## 2020-06-22 NOTE — Progress Notes (Signed)
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  Last dialyzed earlier this month.  Mention that unable to go for dialysis due to lack of transportation.  Presented with worsening shortness of breath and dry cough since 1 week.  Recently admitted at [facility] for missed dialysis, HCAP due to pansensitive Pseudomonas.  Hospitalized 11 times since 02/2019 for missed dialysis sessions.  Upon arrival to ED: Blood pressure noted to be elevated at 219/119, respiratory rate: 30, hemoglobin: 8.0, creatinine: 15, BUN: 102, calcium: 6.7.  Potassium: 5.9 chest x-ray shows vascular congestion and cardiomegaly.  Nephrology consulted for dialysis.  Patient admitted for fluid overload in the setting of missed hemodialysis appointments.  Assessment & Plan:  Volume overload due to missed dialysis: -Patient presented with worsening shortness of breath and elevated blood pressure -History of noncompliance with dialysis and multiple hospitalization at [facility]. -Blood pressure elevated upon arrival.  Remained afebrile.  Chest x-ray shows vascular congestion and cardiomegaly.  COVID-19 negative.  Troponin x2 -. -Nephrology consulted-appreciate help. -Patient underwent emergent hemodialysis overnight and earlier this morning for ultrafiltration.  She is scheduled to undergo hemodialysis again this morning. -Strict INO's and daily weight. -Monitor vitals closely  Hyperkalemia: Potassium 5.9 upon admission. -Secondary to missed hemodialysis.  Lokelma x1 given. -Resolved  Hypertension urgency:  Blood pressure is improving. -In the setting of volume overload due to missed dialysis -Continue home meds of hydralazine, amlodipine and labetalol  High anion gap metabolic acidosis: -In the setting of ESRD.  Improving.  Anion gap: 18--> 16, bicarb: 16--> 23. -Management as per nephrology.  Watery diarrhea: -Patient had multiple episodes of yellow watery diarrhea since yesterday.  C. difficile panel negative. -Flexi-Seal tube in place. -Imodium as needed ordered.  Uncontrolled type 2 diabetes mellitus: -A1c: 10.0%.  Unsure about patient's compliance.  Continue on sensitive sliding scale insulin and Levemir 15 nightly.  Hyperlipidemia: Continue statin  Anemia of chronic disease: -In the setting of ESRD.  H&H 8.0/25.7 which is around her baseline.  Receiving Anasept 75 mcg q. weekly. -Monitor H&H closely.  History of tracheostomy: -Continue tracheostomy care as per order set. -SLP recommended dysphagia 3 diet.  Morbid obesity with BMI of 53: -Diet modification/exercise and weight loss  Thrombocytopenia: Platelet: 102. -No signs of active bleeding.  Repeat CBC tomorrow AM.  DVT prophylaxis: Heparin/SCD Code Status: Full code Family Communication:  None present at bedside.  Plan of care discussed with patient in length and she verbalized understanding and agreed with it. Disposition Plan: Home in 1 to 2 days  Consultants:   Nephrology  Procedures:   Dialysis  Antimicrobials:   None  Status is: Observation   Dispo: The patient is from: Home              Anticipated d/c is to: Home              Anticipated d/c date is: 1 day              Patient currently is not medically stable to d/c.   Subjective: Patient seen and examined.  Tells me that she has pain in her AV graft area, requested for pain  medications.  Continues to have watery diarrhea and headache.  She denies chest pain, difficulty in breathing, nausea, vomiting, fever, chills, leg swelling, orthopnea or  PND.  Objective: Vitals:   06/22/20 0745 06/22/20 0839 06/22/20 0900 06/22/20 1155  BP:  (!) 152/82    Pulse: 88 79  73  Resp: 14 15 15 14   Temp:  99.4 F (37.4 C)    TempSrc:  Oral    SpO2: 100%   100%  Weight:  124.4 kg    Height:        Intake/Output Summary (Last 24 hours) at 06/22/2020 1209 Last data filed at 06/22/2020 1128 Gross per 24 hour  Intake 330 ml  Output 3704 ml  Net -3374 ml   Filed Weights   06/21/20 0125 06/22/20 0839  Weight: 131.5 kg 124.4 kg    Examination:  General exam: Appears calm and comfortable, has trach, obese, communicating well respiratory system: Clear to auscultation. Respiratory effort normal. Cardiovascular system: S1 & S2 heard, RRR. No JVD, murmurs, rubs, gallops or clicks. No pedal edema. Gastrointestinal system: Abdomen is nondistended, soft and nontender. No organomegaly or masses felt. Normal bowel sounds heard. Central nervous system: Alert and oriented. No focal neurological deficits. Extremities: Symmetric 5 x 5 power. Skin: No rashes, lesions or ulcers Psychiatry: Judgement and insight appear normal. Mood & affect appropriate.    Data Reviewed: I have personally reviewed following labs and imaging studies  CBC: Recent Labs  Lab 06/21/20 0225 06/21/20 2315 06/22/20 0354  WBC 6.0 6.3 5.2  NEUTROABS 3.8  --   --   HGB 8.0* 7.9* 7.6*  HCT 25.7* 25.9* 23.9*  MCV 90.8 91.2 87.5  PLT 102* 99* 627*   Basic Metabolic Panel: Recent Labs  Lab 06/21/20 0225 06/21/20 2315 06/22/20 0354  NA 136 135 135  K 5.9* 5.9* 3.5  CL 102 103 96*  CO2 16* 14* 23  GLUCOSE 206* 190* 150*  BUN 102* 107* 44*  CREATININE 15.29* 16.52* 9.09*  CALCIUM 6.7* 6.5* 7.6*  MG 2.2  --   --   PHOS  --  9.6*  --    GFR: Estimated Creatinine Clearance: 9.4 mL/min (A) (by C-G formula based on SCr of 9.09 mg/dL (H)). Liver Function Tests: Recent Labs  Lab 06/21/20 2315  ALBUMIN 3.6   No results for input(s): LIPASE, AMYLASE in the last  168 hours. No results for input(s): AMMONIA in the last 168 hours. Coagulation Profile: No results for input(s): INR, PROTIME in the last 168 hours. Cardiac Enzymes: No results for input(s): CKTOTAL, CKMB, CKMBINDEX, TROPONINI in the last 168 hours. BNP (last 3 results) No results for input(s): PROBNP in the last 8760 hours. HbA1C: Recent Labs    06/21/20 0736  HGBA1C 10.0*   CBG: Recent Labs  Lab 06/21/20 0802 06/21/20 1307 06/21/20 1754 06/21/20 2047 06/22/20 0747  GLUCAP 156* 158* 148* 183* 162*   Lipid Profile: No results for input(s): CHOL, HDL, LDLCALC, TRIG, CHOLHDL, LDLDIRECT in the last 72 hours. Thyroid Function Tests: No results for input(s): TSH, T4TOTAL, FREET4, T3FREE, THYROIDAB in the last 72 hours. Anemia Panel: No results for input(s): VITAMINB12, FOLATE, FERRITIN, TIBC, IRON, RETICCTPCT in the last 72 hours. Sepsis Labs: No results for input(s): PROCALCITON, LATICACIDVEN in the last 168 hours.  Recent Results (from the past 240 hour(s))  Resp Panel by RT-PCR (Flu A&B, Covid) Nasopharyngeal Swab     Status: None   Collection Time: 06/21/20  1:35 AM   Specimen: Nasopharyngeal Swab;  Nasopharyngeal(NP) swabs in vial transport medium  Result Value Ref Range Status   SARS Coronavirus 2 by RT PCR NEGATIVE NEGATIVE Final    Comment: (NOTE) SARS-CoV-2 target nucleic acids are NOT DETECTED.  The SARS-CoV-2 RNA is generally detectable in upper respiratory specimens during the acute phase of infection. The lowest concentration of SARS-CoV-2 viral copies this assay can detect is 138 copies/mL. A negative result does not preclude SARS-Cov-2 infection and should not be used as the sole basis for treatment or other patient management decisions. A negative result may occur with  improper specimen collection/handling, submission of specimen other than nasopharyngeal swab, presence of viral mutation(s) within the areas targeted by this assay, and inadequate number of  viral copies(<138 copies/mL). A negative result must be combined with clinical observations, patient history, and epidemiological information. The expected result is Negative.  Fact Sheet for Patients:  EntrepreneurPulse.com.au  Fact Sheet for Healthcare Providers:  IncredibleEmployment.be  This test is no t yet approved or cleared by the [place] FDA and  has been authorized for detection and/or diagnosis of SARS-CoV-2 by FDA under an Emergency Use Authorization (EUA). This EUA will remain  in effect (meaning this test can be used) for the duration of the COVID-19 declaration under Section 564(b)(1) of the Act, 21 U.S.C.section 360bbb-3(b)(1), unless the authorization is terminated  or revoked sooner.       Influenza A by PCR NEGATIVE NEGATIVE Final   Influenza B by PCR NEGATIVE NEGATIVE Final    Comment: (NOTE) The Xpert Xpress SARS-CoV-2/FLU/RSV plus assay is intended as an aid in the diagnosis of influenza from Nasopharyngeal swab specimens and should not be used as a sole basis for treatment. Nasal washings and aspirates are unacceptable for Xpert Xpress SARS-CoV-2/FLU/RSV testing.  Fact Sheet for Patients: EntrepreneurPulse.com.au  Fact Sheet for Healthcare Providers: IncredibleEmployment.be  This test is not yet approved or cleared by the [place] FDA and has been authorized for detection and/or diagnosis of SARS-CoV-2 by FDA under an Emergency Use Authorization (EUA). This EUA will remain in effect (meaning this test can be used) for the duration of the COVID-19 declaration under Section 564(b)(1) of the Act, 21 U.S.C. section 360bbb-3(b)(1), unless the authorization is terminated or revoked.  Performed at [facility], [place] [address], [place], [place] [address]   MRSA PCR Screening     Status: Abnormal   Collection Time: 06/21/20  6:46 PM   Specimen: Nasopharyngeal  Result  Value Ref Range Status   MRSA by PCR POSITIVE (A) NEGATIVE Final    Comment:        The GeneXpert MRSA Assay (FDA approved for NASAL specimens only), is one component of a comprehensive MRSA colonization surveillance program. It is not intended to diagnose MRSA infection nor to guide or monitor treatment for MRSA infections. RESULT CALLED TO, READ BACK BY AND VERIFIED WITH: MATOS,Y RN 06/21/2020 AT 2214 SKEEN,P Performed at [facility], [place] [address] [place], [place] [address]   C Difficile Quick Screen w PCR reflex     Status: None   Collection Time: 06/22/20  3:20 AM   Specimen: STOOL  Result Value Ref Range Status   C Diff antigen NEGATIVE NEGATIVE Final   C Diff toxin NEGATIVE NEGATIVE Final   C Diff interpretation No C. difficile detected.  Final    Comment: Performed at [facility], [place] [address], [place], [place] [address]      Radiology Studies: DG Chest Portable 1 View  Result Date: 06/21/2020 CLINICAL DATA:  Shortness of breath EXAM: PORTABLE CHEST 1 VIEW COMPARISON:  None. FINDINGS: Tracheostomy tube in place. Cardiomegaly with vascular congestion. Bibasilar atelectasis. No effusions or acute bony abnormality. IMPRESSION: Cardiomegaly with vascular congestion and bibasilar atelectasis. Electronically Signed   By: Rolm Baptise M.D.   On: 06/21/2020 02:10    Scheduled Meds: . amLODipine  10 mg Oral Daily  . Chlorhexidine Gluconate Cloth  6 each Topical Q0600  . heparin  5,000 Units Subcutaneous Q8H  . hydrALAZINE  25 mg Oral Q8H  . insulin aspart  0-9 Units Subcutaneous TID WC  . insulin detemir  15 Units Subcutaneous QHS  . labetalol  300 mg Oral BID  . mupirocin ointment  1 application Nasal BID  . rosuvastatin  5 mg Oral QHS  . sevelamer carbonate  2,400 mg Oral TID WC   Continuous Infusions:   LOS: 0 days   Time spent: 40 minutes   Cecil Vandyke Loann Quill, MD [facility]  If 7PM-7AM, please contact  night-coverage [email or web address] 06/22/2020, 12:09 PM

## 2020-06-22 NOTE — Progress Notes (Signed)
   06/22/20 0945  Clinical Encounter Type  Visited With Patient  Visit Type Initial  Referral From Nurse  Consult/Referral To Chaplain  Chaplain responded to consult. Patient stated she was hoping to go home but was in pain. Chaplain offered social support and prayer.This note was prepared by Jeanine Luz, M.Div..  For questions please contact by phone [phone].

## 2020-06-22 NOTE — Progress Notes (Signed)
Patient ID: Pamela Rogers, female   DOB: 05-Dec-1970, 49 y.o.   MRN: [id number]  [facility] Progress Note   Assessment/ Plan:   1.  Volume overload with shortness of breath/hypertensive urgency: Secondary to nonadherence with hemodialysis and improving clinically with hemodialysis/ultrafiltration.  Reeducated regarding importance of adherence and will continue efforts at optimization with HD. 2. ESRD: Underwent emergent hemodialysis overnight/earlier this morning for ultrafiltration and is scheduled to undergo hemodialysis again today to return back to her usual MWF schedule.  Clinically appears to be improving.  Left radiocephalic fistula functional. 3. Anemia: Low hemoglobin/hematocrit without overt blood loss.  Primary reason likely from missed ESA doses/missed hemodialysis treatments.  We will continue to trend. 4. CKD-MBD: With hyperphosphatemia secondary to nonadherence with dialysis/poor adherence with binders.  Reeducated and will continue to follow phosphorus levels.  She is on calcitriol for PTH suppression. 5. Nutrition: Continue renal diet 6. Hypertension: Blood pressure improving with hemodialysis/ultrafiltration along with resumption of oral antihypertensive therapy.  Subjective:   Reports to be feeling sleepy this morning after hemodialysis overnight.  States that breathing is somewhat better.   Objective:   BP 136/63 (BP Location: Right Wrist)   Pulse 90   Temp 98.9 F (37.2 C) (Oral)   Resp 15   Ht 5\' 2"  (1.575 m)   Wt 131.5 kg   SpO2 100%   BMI 53.04 kg/m   Physical Exam: Gen: Somnolent resting in bed, awakens to calling out her name/touch CVS: Pulse regular rhythm, normal rate, S1 and S2 normal Resp: Anteriorly coarse/transmitted breath sounds bilaterally, no distinct rhonchi Abd: Soft, obese, nontender Ext: Trace ankle edema.  Left radiocephalic fistula with intact dressings for  Labs: BMET Recent Labs  Lab 06/21/20 0225 06/21/20 2315  06/22/20 0354  NA 136 135 135  K 5.9* 5.9* 3.5  CL 102 103 96*  CO2 16* 14* 23  GLUCOSE 206* 190* 150*  BUN 102* 107* 44*  CREATININE 15.29* 16.52* 9.09*  CALCIUM 6.7* 6.5* 7.6*  PHOS  --  9.6*  --    CBC Recent Labs  Lab 06/21/20 0225 06/21/20 2315 06/22/20 0354  WBC 6.0 6.3 5.2  NEUTROABS 3.8  --   --   HGB 8.0* 7.9* 7.6*  HCT 25.7* 25.9* 23.9*  MCV 90.8 91.2 87.5  PLT 102* 99* 102*      Medications:    . amLODipine  10 mg Oral Daily  . Chlorhexidine Gluconate Cloth  6 each Topical Q0600  . heparin  5,000 Units Subcutaneous Q8H  . hydrALAZINE  25 mg Oral Q8H  . insulin aspart  0-9 Units Subcutaneous TID WC  . insulin detemir  15 Units Subcutaneous QHS  . labetalol  300 mg Oral BID  . mupirocin ointment  1 application Nasal BID  . rosuvastatin  5 mg Oral QHS  . sevelamer carbonate  2,400 mg Oral TID WC   Elmarie Shiley, MD 06/22/2020, 7:23 AM

## 2020-06-22 NOTE — Progress Notes (Signed)
   06/22/20 0145  Hand-Off documentation  Handoff Given Given to shift RN/LPN  Report given to (Full Name) Karlton Lemon, RN  Handoff Received Received from shift RN/LPN  Report received from (Full Name) Jamis Kryder, RN  Vitals  Temp 98.8 F (37.1 C)  Temp Source Axillary  BP (!) 158/80  MAP (mmHg) 103  BP Location Right Arm  BP Method Automatic  Patient Position (if appropriate) Lying  Pulse Rate 81  Pulse Rate Source Monitor  ECG Heart Rate 83  Resp 13  Oxygen Therapy  SpO2 100 %  O2 Device Tracheostomy Collar  O2 Flow Rate (L/min) 6 L/min  Pain Assessment  Pain Scale 0-10  Pain Score 0  Post-Hemodialysis Assessment  Rinseback Volume (mL) 250 mL  KECN 264 V  Dialyzer Clearance Lightly streaked  Duration of HD Treatment -hour(s) 3 hour(s)  Hemodialysis Intake (mL) 500 mL  UF Total -Machine (mL) 4004 mL  Net UF (mL) 3504 mL  Tolerated HD Treatment Yes  Post-Hemodialysis Comments tx achieved as scheduled. tolerated well, but has been elevated. a/ox4, stable, and no complaints.  AVG/AVF Arterial Site Held (minutes) 10 minutes  AVG/AVF Venous Site Held (minutes) 10 minutes  Education / Care Plan  Dialysis Education Provided Yes  Documented Education in Care Plan Yes  Note  Observations has slept for whole tx.

## 2020-06-22 NOTE — Progress Notes (Signed)
  Speech Language Pathology Treatment: Dysphagia;Passy Muir Speaking valve  Patient Details Name: Pamela Rogers MRN: [id number] DOB: Jan 22, 1971 Today's Date: 06/22/2020 Time: 1120 (1125)-1134 SLP Time Calculation (min) (ACUTE ONLY): 14 min  Assessment / Plan / Recommendation Clinical Impression  Pt tolerating independent use of PMSV during all waking hours. PMSV in place at the time of assessment with no signs of intolerance. Pt observed to have one throat clear after initial sip, cued pt to try a single sip and oral hold, then swallow, which she carried over to independence with no further throat clearing. Recommend pt continue current diet, but educated her on the probability of slightly mistimed swallow response and the benefit of single sips to reduce irritating cough. Pt verbalized understanding. No further SLP interventions needed acutely as education is complete.   HPI HPI: Pt is a 49 y.o. 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 Pt was admitted with progressively worsening SOB after multiple missed sessions of dialysis. CXR 12/12: Cardiomegaly with vascular congestion and bibasilar atelectasis. Per notes in care everywhere, pt was fitted with ProTrach DualValve 01/21/19 by an outpatient SLP at [place]  All goals met       Recommendations  Diet recommendations: Dysphagia 3 (mechanical soft);Thin liquid Liquids provided via: Cup;Straw Compensations: Slow rate;Small sips/bites      Patient may use Passy-Muir Speech Valve: During PO intake/meals;During all waking hours (remove during sleep) PMSV Supervision: Intermittent         Follow up Recommendations: None Plan: All goals met       GO                Anessia Oakland, Katherene Ponto 06/22/2020, 12:51 PM

## 2020-06-23 LAB — CBC
HCT: 23.2 % — ABNORMAL LOW (ref 36.0–46.0)
Hemoglobin: 7.2 g/dL — ABNORMAL LOW (ref 12.0–15.0)
MCH: 27.9 pg (ref 26.0–34.0)
MCHC: 31 g/dL (ref 30.0–36.0)
MCV: 89.9 fL (ref 80.0–100.0)
Platelets: 106 10*3/uL — ABNORMAL LOW (ref 150–400)
RBC: 2.58 MIL/uL — ABNORMAL LOW (ref 3.87–5.11)
RDW: 15.8 % — ABNORMAL HIGH (ref 11.5–15.5)
WBC: 6.1 10*3/uL (ref 4.0–10.5)
nRBC: 0 % (ref 0.0–0.2)

## 2020-06-23 LAB — GLUCOSE, CAPILLARY
Glucose-Capillary: 106 mg/dL — ABNORMAL HIGH (ref 70–99)
Glucose-Capillary: 83 mg/dL (ref 70–99)
Glucose-Capillary: 171 mg/dL — ABNORMAL HIGH (ref 70–99)
Glucose-Capillary: 248 mg/dL — ABNORMAL HIGH (ref 70–99)

## 2020-06-23 LAB — HEPATITIS B SURFACE ANTIBODY, QUANTITATIVE: Hep B S AB Quant (Post): 14.7 m[IU]/mL (ref >9.9)

## 2020-06-23 LAB — BASIC METABOLIC PANEL
Anion gap: 14 (ref 5–15)
BUN: 58 mg/dL — ABNORMAL HIGH (ref 6–20)
CO2: 22 mmol/L (ref 22–32)
Calcium: 7 mg/dL — ABNORMAL LOW (ref 8.9–10.3)
Chloride: 99 mmol/L (ref 98–111)
Creatinine, Ser: 10.91 mg/dL — ABNORMAL HIGH (ref 0.44–1.00)
GFR, Estimated: 4 mL/min — ABNORMAL LOW (ref >60)
Glucose, Bld: 161 mg/dL — ABNORMAL HIGH (ref 70–99)
Potassium: 4.9 mmol/L (ref 3.5–5.1)
Sodium: 135 mmol/L (ref 135–145)

## 2020-06-23 LAB — MAGNESIUM: Magnesium: 2 mg/dL (ref 1.7–2.4)

## 2020-06-23 MED ORDER — ACETAMINOPHEN 325 MG PO TABS
ORAL_TABLET | ORAL | Status: AC
  Filled 2020-06-23: qty 2

## 2020-06-23 NOTE — Procedures (Signed)
Patient seen on Hemodialysis. BP 129/63   Pulse 70   Temp 98 F (36.7 C) (Oral)   Resp 11   Ht 5\' 2"  (1.575 m)   Wt 116.5 kg   SpO2 100%   BMI 46.98 kg/m   QB 400, UF goal 2.5L Tolerating treatment without complaints at this time.   Elmarie Shiley MD [facility]. Office # [phone] Pager # [phone] 9:16 AM

## 2020-06-23 NOTE — Discharge Summary (Signed)
Physician Discharge Summary  Trenace Coughlin EQA:[id number] DOB: 1970-09-18 DOA: 06/21/2020  PCP: Patient, No Pcp Per  Admit date: 06/21/2020 Discharge date: 06/23/2020  Admitted From: Home Disposition: Home  Recommendations for Outpatient Follow-up:  1. Follow-up with PCP in 1 week 2. Dialysis tomorrow 3. 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  Last dialyzed earlier this month.  Mention that unable to go for dialysis due to lack of transportation.  Presented with worsening shortness of breath and dry cough since 1 week.  Recently admitted at [facility] for missed dialysis, HCAP due to pansensitive Pseudomonas.  Hospitalized 11 times since 02/2019 for missed dialysis sessions.  Upon arrival to ED: Blood pressure noted to be elevated at 219/119, respiratory rate: 30, hemoglobin: 8.0, creatinine: 15, BUN: 102, calcium: 6.7.  Potassium: 5.9 chest x-ray shows vascular congestion and cardiomegaly.  Nephrology consulted for dialysis.  Patient admitted for fluid overload in the setting of missed hemodialysis appointments.  Volume overload due to missed dialysis: -Blood pressure elevated upon arrival.   Chest x-ray shows vascular congestion and cardiomegaly.  COVID-19 negative.  Troponin x2 -. -Nephrology consulted-appreciate help. -Patient underwent emergent hemodialysis overnight, earlier yesterday and this morning. -Her symptoms and blood pressure improved.  Okay to DC home  from nephrology standpoint.  Resume outpatient hemodialysis tomorrow.  Hyperkalemia: Potassium 5.9 upon admission. -Secondary to missed hemodialysis.  Lokelma x1 given. -Resolved  Hypertension urgency:  Blood pressure improved after hemodialysis. -In the setting of volume overload due to missed dialysis -Continued home meds of hydralazine, amlodipine and labetalol  High anion gap metabolic acidosis: -In the setting of ESRD.    Resolved after hemodialysis  Watery diarrhea: -Patient had multiple episodes of yellow watery diarrhea since yesterday.  C. difficile panel negative. -Imodium as needed ordered.  Patient symptoms improved.  Uncontrolled type 2 diabetes mellitus: -A1c: 10.0%.  Unsure about patient's compliance.    Started  on sensitive sliding scale insulin and Levemir 15 nightly.  Resumed home regimen of insulin at the time of discharge.  Follow-up with PCP outpatient.  Hyperlipidemia: Continued statin  Anemia of chronic disease: -In the setting of ESRD.  H&H 8.0/25.7 which is around her baseline.  Receiving Anasept 75 mcg q. weekly. -Monitor H&H closely.  History of tracheostomy: -Continued tracheostomy care as per order set. -SLP recommended dysphagia 3 diet.  Morbid obesity with BMI of 53: -Diet modification/exercise and weight loss  Thrombocytopenia: Chronic.  No signs of active bleeding.  Discharge Diagnoses:  Principal Problem:   Non-compliance with renal dialysis ([place]) Active Problems:   DM2 (diabetes mellitus, type 2) ([facility])   HTN (hypertension)   ESRD (end stage renal disease) ([place])   Volume overload   Tracheostomy status [facility])    Discharge Instructions  Discharge Instructions    Diet - low sodium heart healthy   Complete by: As directed    Discharge instructions   Complete by: As directed    Follow-up with PCP in 1 weekly Dialysis tomorrow Advised to be compliant with dialysis and medications   Increase activity slowly   Complete by: As  directed  Allergies as of 06/23/2020      Reactions   Hydrocodone-acetaminophen Swelling   Hydrocodone Swelling   Morphine And Related Rash   Penicillins Rash, Other (See Comments)   Childhood allergy. Tolerated IV ampicillin 03/2017      Medication List    STOP taking these medications   ciprofloxacin 750 MG tablet Commonly known as: CIPRO     TAKE these medications   acetaminophen 500 MG tablet Commonly known as: TYLENOL Take 1,000 mg by mouth as needed for pain.   Amitiza 24 MCG capsule Generic drug: lubiprostone Take 24 mcg by mouth 2 (two) times daily as needed for constipation.   amLODipine 10 MG tablet Commonly known as: NORVASC Take 10 mg by mouth daily.   ARIPiprazole 5 MG tablet Commonly known as: ABILIFY Take 5 mg by mouth daily.   aspirin EC 81 MG tablet Take 81 mg by mouth daily. Swallow whole.   cetirizine 10 MG tablet Commonly known as: ZYRTEC Take 10 mg by mouth daily.   cinacalcet 30 MG tablet Commonly known as: SENSIPAR Take 30 mg by mouth daily with breakfast.   clobetasol cream 0.05 % Commonly known as: TEMOVATE Apply 1 application topically daily.   famotidine 20 MG tablet Commonly known as: PEPCID Take 20 mg by mouth 3 (three) times a week. Monday, Wednesday, Friday   fluticasone 50 MCG/ACT nasal spray Commonly known as: FLONASE Place 2 sprays into both nostrils daily.   gabapentin 100 MG capsule Commonly known as: NEURONTIN Take 100 mg by mouth daily.   hydrALAZINE 25 MG tablet Commonly known as: APRESOLINE Take 25 mg by mouth 3 (three) times daily.   insulin aspart 100 UNIT/ML FlexPen Commonly known as: NOVOLOG Inject 2-12 Units into the skin 3 (three) times daily with meals. Sliding Scale: BG = 180-200: 2 units;  BG = 201-250: 5 units;  BG = 251-300: 7 units;  BG = 301-350: 10 units;  BG = 351-400: 12 units.   insulin detemir 100 UNIT/ML injection Commonly known as: LEVEMIR Inject 30 Units into the skin at  bedtime.   labetalol 300 MG tablet Commonly known as: NORMODYNE Take 300 mg by mouth 2 (two) times daily.   loperamide 2 MG capsule Commonly known as: IMODIUM Take 2 mg by mouth as needed for diarrhea or loose stools.   nitroGLYCERIN 0.4 MG SL tablet Commonly known as: NITROSTAT Place 0.4 mg under the tongue every 5 (five) minutes as needed for chest pain.   ondansetron 4 MG tablet Commonly known as: ZOFRAN Take 4 mg by mouth as needed for nausea/vomiting.   pantoprazole 20 MG tablet Commonly known as: PROTONIX Take 20 mg by mouth daily.   polyethylene glycol 17 g packet Commonly known as: MIRALAX / GLYCOLAX Take 17 g by mouth as needed for constipation.   ProAir HFA 108 (90 Base) MCG/ACT inhaler Generic drug: albuterol Inhale 2 puffs into the lungs every 4 (four) hours as needed for wheezing or shortness of breath.   rosuvastatin 5 MG tablet Commonly known as: CRESTOR Take 5 mg by mouth at bedtime.   sevelamer carbonate 800 MG tablet Commonly known as: RENVELA Take 2,400 mg by mouth 3 (three) times daily with meals.   Viibryd 20 MG Tabs Generic drug: Vilazodone HCl Take 20 mg by mouth daily.       Allergies  Allergen Reactions  . Hydrocodone-Acetaminophen Swelling  . Hydrocodone Swelling  . Morphine And Related Rash  . Penicillins Rash and Other (See Comments)  Childhood allergy. Tolerated IV ampicillin 03/2017    Consultations:  Nephrology   Procedures/Studies: DG Chest Portable 1 View  Result Date: 06/21/2020 CLINICAL DATA:  Shortness of breath EXAM: PORTABLE CHEST 1 VIEW COMPARISON:  None. FINDINGS: Tracheostomy tube in place. Cardiomegaly with vascular congestion. Bibasilar atelectasis. No effusions or acute bony abnormality. IMPRESSION: Cardiomegaly with vascular congestion and bibasilar atelectasis. Electronically Signed   By: Rolm Baptise M.D.   On: 06/21/2020 02:10       Subjective: Patient seen and examined in hemodialysis.  Tells me  that she feels much better this morning.  Denies shortness of breath, leg swelling, chest pain, orthopnea, PND.  Her diarrhea also resolved.  Denies abdominal pain, nausea or vomiting.  Tells me that she is comfortable going home.  Discharge Exam: Vitals:   06/23/20 1030 06/23/20 1053  BP: 121/60 136/63  Pulse: 68 70  Resp: 12 11  Temp:  98.4 F (36.9 C)  SpO2: 100% 100%   Vitals:   06/23/20 1000 06/23/20 1030 06/23/20 1053 06/23/20 1101  BP: 134/67 121/60 136/63   Pulse: 69 68 70   Resp: 11 12 11    Temp:   98.4 F (36.9 C)   TempSrc:   Oral   SpO2: 100% 100% 100%   Weight:    112.5 kg  Height:        General: Pt is alert, awake, not in acute distress, obese, has trach, communicating well Cardiovascular: RRR, S1/S2 +, no rubs, no gallops Respiratory: CTA bilaterally, no wheezing, no rhonchi Abdominal: Soft, NT, ND, bowel sounds + Extremities: no edema, no cyanosis    The results of significant diagnostics from this hospitalization (including imaging, microbiology, ancillary and laboratory) are listed below for reference.     Microbiology: Recent Results (from the past 240 hour(s))  Resp Panel by RT-PCR (Flu A&B, Covid) Nasopharyngeal Swab     Status: None   Collection Time: 06/21/20  1:35 AM   Specimen: Nasopharyngeal Swab; Nasopharyngeal(NP) swabs in vial transport medium  Result Value Ref Range Status   SARS Coronavirus 2 by RT PCR NEGATIVE NEGATIVE Final    Comment: (NOTE) SARS-CoV-2 target nucleic acids are NOT DETECTED.  The SARS-CoV-2 RNA is generally detectable in upper respiratory specimens during the acute phase of infection. The lowest concentration of SARS-CoV-2 viral copies this assay can detect is 138 copies/mL. A negative result does not preclude SARS-Cov-2 infection and should not be used as the sole basis for treatment or other patient management decisions. A negative result may occur with  improper specimen collection/handling, submission of  specimen other than nasopharyngeal swab, presence of viral mutation(s) within the areas targeted by this assay, and inadequate number of viral copies(<138 copies/mL). A negative result must be combined with clinical observations, patient history, and epidemiological information. The expected result is Negative.  Fact Sheet for Patients:  EntrepreneurPulse.com.au  Fact Sheet for Healthcare Providers:  IncredibleEmployment.be  This test is no t yet approved or cleared by the [place] FDA and  has been authorized for detection and/or diagnosis of SARS-CoV-2 by FDA under an Emergency Use Authorization (EUA). This EUA will remain  in effect (meaning this test can be used) for the duration of the COVID-19 declaration under Section 564(b)(1) of the Act, 21 U.S.C.section 360bbb-3(b)(1), unless the authorization is terminated  or revoked sooner.       Influenza A by PCR NEGATIVE NEGATIVE Final   Influenza B by PCR NEGATIVE NEGATIVE Final    Comment: (NOTE) The Xpert Xpress  SARS-CoV-2/FLU/RSV plus assay is intended as an aid in the diagnosis of influenza from Nasopharyngeal swab specimens and should not be used as a sole basis for treatment. Nasal washings and aspirates are unacceptable for Xpert Xpress SARS-CoV-2/FLU/RSV testing.  Fact Sheet for Patients: EntrepreneurPulse.com.au  Fact Sheet for Healthcare Providers: IncredibleEmployment.be  This test is not yet approved or cleared by the [place] FDA and has been authorized for detection and/or diagnosis of SARS-CoV-2 by FDA under an Emergency Use Authorization (EUA). This EUA will remain in effect (meaning this test can be used) for the duration of the COVID-19 declaration under Section 564(b)(1) of the Act, 21 U.S.C. section 360bbb-3(b)(1), unless the authorization is terminated or revoked.  Performed at [facility], [place] [address]  [place], [place] [address]   MRSA PCR Screening     Status: Abnormal   Collection Time: 06/21/20  6:46 PM   Specimen: Nasopharyngeal  Result Value Ref Range Status   MRSA by PCR POSITIVE (A) NEGATIVE Final    Comment:        The GeneXpert MRSA Assay (FDA approved for NASAL specimens only), is one component of a comprehensive MRSA colonization surveillance program. It is not intended to diagnose MRSA infection nor to guide or monitor treatment for MRSA infections. RESULT CALLED TO, READ BACK BY AND VERIFIED WITH: MATOS,Y RN 06/21/2020 AT 2214 SKEEN,P Performed at [facility], [place] [address], [place], [place] [address]   C Difficile Quick Screen w PCR reflex     Status: None   Collection Time: 06/22/20  3:20 AM   Specimen: STOOL  Result Value Ref Range Status   C Diff antigen NEGATIVE NEGATIVE Final   C Diff toxin NEGATIVE NEGATIVE Final   C Diff interpretation No C. difficile detected.  Final    Comment: Performed at [facility], [place] [address] [place], [place] [address]     Labs: BNP (last 3 results) No results for input(s): BNP in the last 8760 hours. Basic Metabolic Panel: Recent Labs  Lab 06/21/20 0225 06/21/20 2315 06/22/20 0354 06/23/20 0226  NA 136 135 135 135  K 5.9* 5.9* 3.5 4.9  CL 102 103 96* 99  CO2 16* 14* 23 22  GLUCOSE 206* 190* 150* 161*  BUN 102* 107* 44* 58*  CREATININE 15.29* 16.52* 9.09* 10.91*  CALCIUM 6.7* 6.5* 7.6* 7.0*  MG 2.2  --   --  2.0  PHOS  --  9.6*  --   --    Liver Function Tests: Recent Labs  Lab 06/21/20 2315  ALBUMIN 3.6   No results for input(s): LIPASE, AMYLASE in the last 168 hours. No results for input(s): AMMONIA in the last 168 hours. CBC: Recent Labs  Lab 06/21/20 0225 06/21/20 2315 06/22/20 0354 06/23/20 0226  WBC 6.0 6.3 5.2 6.1  NEUTROABS 3.8  --   --   --   HGB 8.0* 7.9* 7.6* 7.2*  HCT 25.7* 25.9* 23.9* 23.2*  MCV 90.8 91.2 87.5 89.9  PLT 102* 99* 102* 106*   Cardiac Enzymes: No results  for input(s): CKTOTAL, CKMB, CKMBINDEX, TROPONINI in the last 168 hours. BNP: Invalid input(s): POCBNP CBG: Recent Labs  Lab 06/22/20 0747 06/22/20 1209 06/22/20 1637 06/22/20 2148 06/23/20 0626  GLUCAP 162* 135* 148* 158* 106*   D-Dimer No results for input(s): DDIMER in the last 72 hours. Hgb A1c Recent Labs    06/21/20 0736  HGBA1C 10.0*   Lipid Profile No results for input(s): CHOL,  HDL, LDLCALC, TRIG, CHOLHDL, LDLDIRECT in the last 72 hours. Thyroid function studies No results for input(s): TSH, T4TOTAL, T3FREE, THYROIDAB in the last 72 hours.  Invalid input(s): FREET3 Anemia work up No results for input(s): VITAMINB12, FOLATE, FERRITIN, TIBC, IRON, RETICCTPCT in the last 72 hours. Urinalysis No results found for: COLORURINE, APPEARANCEUR, [place], [place], GLUCOSEU, [place], [place], [place], PROTEINUR, UROBILINOGEN, NITRITE, LEUKOCYTESUR Sepsis Labs Invalid input(s): PROCALCITONIN,  WBC,  LACTICIDVEN Microbiology Recent Results (from the past 240 hour(s))  Resp Panel by RT-PCR (Flu A&B, Covid) Nasopharyngeal Swab     Status: None   Collection Time: 06/21/20  1:35 AM   Specimen: Nasopharyngeal Swab; Nasopharyngeal(NP) swabs in vial transport medium  Result Value Ref Range Status   SARS Coronavirus 2 by RT PCR NEGATIVE NEGATIVE Final    Comment: (NOTE) SARS-CoV-2 target nucleic acids are NOT DETECTED.  The SARS-CoV-2 RNA is generally detectable in upper respiratory specimens during the acute phase of infection. The lowest concentration of SARS-CoV-2 viral copies this assay can detect is 138 copies/mL. A negative result does not preclude SARS-Cov-2 infection and should not be used as the sole basis for treatment or other patient management decisions. A negative result may occur with  improper specimen collection/handling, submission of specimen other than nasopharyngeal swab, presence of viral mutation(s) within the areas targeted by this assay, and  inadequate number of viral copies(<138 copies/mL). A negative result must be combined with clinical observations, patient history, and epidemiological information. The expected result is Negative.  Fact Sheet for Patients:  EntrepreneurPulse.com.au  Fact Sheet for Healthcare Providers:  IncredibleEmployment.be  This test is no t yet approved or cleared by the [place] FDA and  has been authorized for detection and/or diagnosis of SARS-CoV-2 by FDA under an Emergency Use Authorization (EUA). This EUA will remain  in effect (meaning this test can be used) for the duration of the COVID-19 declaration under Section 564(b)(1) of the Act, 21 U.S.C.section 360bbb-3(b)(1), unless the authorization is terminated  or revoked sooner.       Influenza A by PCR NEGATIVE NEGATIVE Final   Influenza B by PCR NEGATIVE NEGATIVE Final    Comment: (NOTE) The Xpert Xpress SARS-CoV-2/FLU/RSV plus assay is intended as an aid in the diagnosis of influenza from Nasopharyngeal swab specimens and should not be used as a sole basis for treatment. Nasal washings and aspirates are unacceptable for Xpert Xpress SARS-CoV-2/FLU/RSV testing.  Fact Sheet for Patients: EntrepreneurPulse.com.au  Fact Sheet for Healthcare Providers: IncredibleEmployment.be  This test is not yet approved or cleared by the [place] FDA and has been authorized for detection and/or diagnosis of SARS-CoV-2 by FDA under an Emergency Use Authorization (EUA). This EUA will remain in effect (meaning this test can be used) for the duration of the COVID-19 declaration under Section 564(b)(1) of the Act, 21 U.S.C. section 360bbb-3(b)(1), unless the authorization is terminated or revoked.  Performed at [facility], [place] [address], [place], [place] [address]   MRSA PCR Screening     Status: Abnormal   Collection Time: 06/21/20  6:46 PM   Specimen:  Nasopharyngeal  Result Value Ref Range Status   MRSA by PCR POSITIVE (A) NEGATIVE Final    Comment:        The GeneXpert MRSA Assay (FDA approved for NASAL specimens only), is one component of a comprehensive MRSA colonization surveillance program. It is not intended to diagnose MRSA infection nor to guide or monitor treatment for MRSA infections. RESULT CALLED TO, READ BACK BY  AND VERIFIED WITH: MATOS,Y RN 06/21/2020 AT 2214 SKEEN,P Performed at [facility], [place] [address] [place], [place] [address]   C Difficile Quick Screen w PCR reflex     Status: None   Collection Time: 06/22/20  3:20 AM   Specimen: STOOL  Result Value Ref Range Status   C Diff antigen NEGATIVE NEGATIVE Final   C Diff toxin NEGATIVE NEGATIVE Final   C Diff interpretation No C. difficile detected.  Final    Comment: Performed at [facility], [place] [address], [place], [place] [address]     Time coordinating discharge: Over 30 minutes  SIGNED:   Mckinley Jewel, MD  [facility] 06/23/2020, 11:54 AM Pager   If 7PM-7AM, please contact night-coverage [email or web address]

## 2020-06-23 NOTE — Progress Notes (Signed)
Patient ID: Pamela Rogers, female   DOB: 1971-04-07, 49 y.o.   MRN: [id number]  [facility] Progress Note   Assessment/ Plan:   1.  Volume overload with shortness of breath/hypertensive urgency: Secondary to nonadherence with hemodialysis and improving clinically with hemodialysis/ultrafiltration.  We discussed the importance of adherence with hemodialysis and fluid restriction to limit hospitalizations. 2. ESRD: Underwent emergent hemodialysis overnight/earlier yesterday morning/Sunday night and could not be accommodated by schedule for hemodialysis today-now undergoing dialysis off schedule today.  Plan is to discharge her home later today to resume outpatient hemodialysis tomorrow. 3. Anemia: Low hemoglobin/hematocrit without overt blood loss.  Primary reason likely from missed ESA doses/missed hemodialysis treatments.  We will continue to trend. 4. CKD-MBD: With hyperphosphatemia secondary to nonadherence with dialysis/poor adherence with binders.  Reeducated and will continue to follow phosphorus levels.  She is on calcitriol for PTH suppression. 5. Nutrition: Continue renal diet 6. Hypertension: Blood pressure improving with hemodialysis/ultrafiltration along with resumption of oral antihypertensive therapy.  Subjective:   Reports to be feeling better this morning and denies any worsening shortness of breath.  Looking forward to leave the hospital later today.   Objective:   BP (!) 120/59   Pulse 72   Temp 98 F (36.7 C) (Oral)   Resp (!) 26   Ht 5\' 2"  (1.575 m)   Wt 116.5 kg   SpO2 100%   BMI 46.98 kg/m   Physical Exam: Gen: She is comfortable resting in hemodialysis.  On oxygen supplementation via tracheostomy. CVS: Pulse regular rhythm, normal rate, S1 and S2 normal Resp: Anteriorly coarse/transmitted breath sounds bilaterally, no distinct rhonchi Abd: Soft, obese, nontender Ext: Trace ankle edema.  Left radiocephalic fistula cannulated.  Labs: BMET Recent  Labs  Lab 06/21/20 0225 06/21/20 2315 06/22/20 0354 06/23/20 0226  NA 136 135 135 135  K 5.9* 5.9* 3.5 4.9  CL 102 103 96* 99  CO2 16* 14* 23 22  GLUCOSE 206* 190* 150* 161*  BUN 102* 107* 44* 58*  CREATININE 15.29* 16.52* 9.09* 10.91*  CALCIUM 6.7* 6.5* 7.6* 7.0*  PHOS  --  9.6*  --   --    CBC Recent Labs  Lab 06/21/20 0225 06/21/20 2315 06/22/20 0354 06/23/20 0226  WBC 6.0 6.3 5.2 6.1  NEUTROABS 3.8  --   --   --   HGB 8.0* 7.9* 7.6* 7.2*  HCT 25.7* 25.9* 23.9* 23.2*  MCV 90.8 91.2 87.5 89.9  PLT 102* 99* 102* 106*      Medications:    . acetaminophen      . amLODipine  10 mg Oral Daily  . Chlorhexidine Gluconate Cloth  6 each Topical Q0600  . heparin  5,000 Units Subcutaneous Q8H  . hydrALAZINE  25 mg Oral Q8H  . insulin aspart  0-9 Units Subcutaneous TID WC  . insulin detemir  15 Units Subcutaneous QHS  . labetalol  300 mg Oral BID  . mupirocin ointment  1 application Nasal BID  . rosuvastatin  5 mg Oral QHS  . sevelamer carbonate  2,400 mg Oral TID WC   Elmarie Shiley, MD 06/23/2020, 9:14 AM

## 2020-06-23 NOTE — Progress Notes (Signed)
CSW met with pt regarding need for O2.  Pt reports she has worked with a [employer] but does not recall the name, pt thought her daughter may know the name of the company.  CSW spoke with daughter Lesly Rubenstein who reports she does not have any O2.  She then stated that pt cannot return home with her at this point.  She did not provide explanation.  CSW spoke again with pt regarding options.  Pt does have disability income of $795.  Pt reports she spoke with her aunt Jeani Hawking who lives in [place] and she has agreed to allow pt to stay with her.  CSW spoke with [place], [phone].  She said pt can stay with her at this time, but not permanently.  We discussed pt need for dialysis, which is currently set up for [facility].  [place] address: [address], [place], [place].   Winferd Humphrey, MSW, LCSW Advanced Care Supervisor 06/23/2020 3:32 PM

## 2020-06-24 DIAGNOSIS — Z9115 Patient's noncompliance with renal dialysis: Secondary | ICD-10-CM

## 2020-06-24 LAB — GLUCOSE, CAPILLARY
Glucose-Capillary: 203 mg/dL — ABNORMAL HIGH (ref 70–99)
Glucose-Capillary: 170 mg/dL — ABNORMAL HIGH (ref 70–99)
Glucose-Capillary: 203 mg/dL — ABNORMAL HIGH (ref 70–99)
Glucose-Capillary: 254 mg/dL — ABNORMAL HIGH (ref 70–99)

## 2020-06-24 NOTE — Discharge Summary (Signed)
Physician Discharge Summary  Kindsey Eblin DGL:[id number] DOB: Sep 11, 1970 DOA: 06/21/2020  PCP: Patient, No Pcp Per  Admit date: 06/21/2020 Discharge date: 06/24/2020  Admitted From: Home Discharge disposition: Home   Code Status: Full Code  Diet Recommendation: Renal diet  Discharge Diagnosis:   Principal Problem:   Non-compliance with renal dialysis ([place]) Active Problems:   DM2 (diabetes mellitus, type 2) ([place])   HTN (hypertension)   ESRD (end stage renal disease) ([place])   Volume overload   Tracheostomy status ([place])  History of Present Illness / Brief narrative:  Patient is 49 year old female with past medical history of ESRD on hemodialysis MWF, type 2 diabetes mellitus, hypertension, 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 Patient presented to the ED on 12/12 with shortness of breath, dry cough for a week.  She reported that she had missed multiple sessions of dialysis because of being homeless and lack of transportation.  Last dialysis was more than a week ago. Recently admitted at [facility] for missed dialysis, HCAP due to pansensitive Pseudomonas. Hospitalized 11 times since 02/2019 for missed dialysis sessions.  Upon arrival to ED: Blood pressure noted to be elevated at 219/119, respiratory rate: 30, hemoglobin: 8.0, creatinine: 15, BUN: 102, calcium: 6.7. Potassium: 5.9 chest x-ray shows vascular congestion and cardiomegaly.  Nephrology consulted for dialysis.  Patient was admitted for fluid overload in the setting of missed hemodialysis appointments.  High risk for symptoms and elevated blood pressure improved after dialysis sessions were resumed. Outpatient dialysis chair arranged at [facility].  Patient plans to return to stay with her aunt close to the dialysis center  Subjective:  Seen and examined this morning.  Middle-aged African-American female.  Propped  up in bed.  On oxygen by trach collar, 5 L/min.  Feels better.  Feels ready to go home.  Hospital Course:  Volume overload due to missed dialysis: -Presented with shortness of breath, wheezing, blood pressure elevated to 219/119.   -Symptoms improved after dialysis sessions were resumed. Okay to discharge today. Outpatient dialysis chair arranged at [facility].  Hyperkalemia: Potassium 5.9 upon admission. -Secondary to missed hemodialysis. Lokelma x1 given. -Resolved  Hypertension urgency: Blood pressure improved after hemodialysis. -In the setting of volume overload due to missed dialysis -Continued home meds of hydralazine, amlodipine and labetalol -Blood pressure is stable now.  High anion gap metabolic acidosis: -In the setting of ESRD.  Resolved after hemodialysis  Acute respiratory failure with hypoxia  history of tracheostomy: -Patient has a tracheostomy in place.  She states she is not requiring oxygen through it at home.   -This admission, she is requiring up to 5 L of oxygen.  Oxygen for discharge arranged. -SLP recommended dysphagia 3 diet.  Watery diarrhea: -Patient had multiple episodes of yellow watery diarrhea in this admission.. C. difficile panel negative. -Imodium as needed ordered.  Patient symptoms improved.  Uncontrolled type 2 diabetes mellitus: -A1c: 10.0%.   -Home meds shows Levemir 30 units at bedtime and sliding scale insulin.  Suspect noncompliance.  -Resume the same post discharge  Hyperlipidemia:Continued statin  Anemia of chronic disease: -In the setting of ESRD. H&H 8.0/25.7 which is around her baseline. Receiving Anasept 75 mcg q. weekly. -Monitor H&H closely.  Morbid obesity with BMI of 53: -Diet modification/exercise and weight loss  Thrombocytopenia: Chronic.  No signs of active bleeding. Wound care:    Discharge Exam:   Vitals:   06/24/20 0355 06/24/20 0500 06/24/20 0533 06/24/20 0834  BP:  105/74   Pulse: 71   69 72  Resp: 18  18 16   Temp:   97.8 F (36.6 C)   TempSrc:   Oral   SpO2: 100%  100% 100%  Weight:  116.1 kg    Height:        Body mass index is 46.81 kg/m.  General exam: Middle-aged African-American female.  Not in physical distress Skin: No rashes, lesions or ulcers. HEENT: Atraumatic, normocephalic, no obvious bleeding.  Trach tube anteriorly. Lungs: Clear to auscultation bilaterally. CVS: Regular rate and rhythm, no murmur GI/Abd soft, nontender, nondistended, bowel sound present CNS: Alert, awake, oriented x3 Psychiatry: Mood appropriate Extremities: No pedal edema, no calf tenderness  Follow ups:   Discharge Instructions    Diet - low sodium heart healthy   Complete by: As directed    Diet - low sodium heart healthy   Complete by: As directed    Diet Carb Modified   Complete by: As directed    Discharge instructions   Complete by: As directed    Follow-up with PCP in 1 weekly Dialysis tomorrow Advised to be compliant with dialysis and medications   Increase activity slowly   Complete by: As directed    Increase activity slowly   Complete by: As directed       [place] Follow up.   Contact information: [address] [place] [address] [phone]              Recommendations for Outpatient Follow-Up:   1. Follow-up with PCP as an outpatient  Discharge Instructions:  Follow with Primary MD Patient, No Pcp Per in 7 days   Get CBC/BMP checked in next visit within 1 week by PCP or SNF MD ( we routinely change or add medications that can affect your baseline labs and fluid status, therefore we recommend that you get the mentioned basic workup next visit with your PCP, your PCP may decide not to get them or add new tests based on their clinical decision)  On your next visit with your PCP, please Get Medicines reviewed and adjusted.  Please request your PCP  to go over  all Hospital Tests and Procedure/Radiological results at the follow up, please get all Hospital records sent to your Prim MD by signing hospital release before you go home.  Activity: As tolerated with Full fall precautions use walker/cane & assistance as needed  For Heart failure patients - Check your Weight same time everyday, if you gain over 2 pounds, or you develop in leg swelling, experience more shortness of breath or chest pain, call your Primary MD immediately. Follow Cardiac Low Salt Diet and 1.5 lit/day fluid restriction.  If you have smoked or chewed Tobacco in the last 2 yrs please stop smoking, stop any regular Alcohol  and or any Recreational drug use.  If you experience worsening of your admission symptoms, develop shortness of breath, life threatening emergency, suicidal or homicidal thoughts you must seek medical attention immediately by calling 911 or calling your MD immediately  if symptoms less severe.  You Must read complete instructions/literature along with all the possible adverse reactions/side effects for all the Medicines you take and that have been prescribed to you. Take any new Medicines after you have completely understood and accpet all the possible adverse reactions/side effects.   Do not drive, operate heavy machinery, perform activities at heights, swimming or participation in water activities or  provide baby sitting services if your were admitted for syncope or siezures until you have seen by Primary MD or a Neurologist and advised to do so again.  Do not drive when taking Pain medications.  Do not take more than prescribed Pain, Sleep and Anxiety Medications  Wear Seat belts while driving.   Please note You were cared for by a hospitalist during your hospital stay. If you have any questions about your discharge medications or the care you received while you were in the hospital after you are discharged, you can call the unit and asked to speak with the  hospitalist on call if the hospitalist that took care of you is not available. Once you are discharged, your primary care physician will handle any further medical issues. Please note that NO REFILLS for any discharge medications will be authorized once you are discharged, as it is imperative that you return to your primary care physician (or establish a relationship with a primary care physician if you do not have one) for your aftercare needs so that they can reassess your need for medications and monitor your lab values.    Allergies as of 06/24/2020      Reactions   Hydrocodone-acetaminophen Swelling   Hydrocodone Swelling   Morphine And Related Rash   Penicillins Rash, Other (See Comments)   Childhood allergy. Tolerated IV ampicillin 03/2017      Medication List    STOP taking these medications   ciprofloxacin 750 MG tablet Commonly known as: CIPRO     TAKE these medications   acetaminophen 500 MG tablet Commonly known as: TYLENOL Take 1,000 mg by mouth as needed for pain.   Amitiza 24 MCG capsule Generic drug: lubiprostone Take 24 mcg by mouth 2 (two) times daily as needed for constipation.   amLODipine 10 MG tablet Commonly known as: NORVASC Take 10 mg by mouth daily.   ARIPiprazole 5 MG tablet Commonly known as: ABILIFY Take 5 mg by mouth daily.   aspirin EC 81 MG tablet Take 81 mg by mouth daily. Swallow whole.   cetirizine 10 MG tablet Commonly known as: ZYRTEC Take 10 mg by mouth daily.   cinacalcet 30 MG tablet Commonly known as: SENSIPAR Take 30 mg by mouth daily with breakfast.   clobetasol cream 0.05 % Commonly known as: TEMOVATE Apply 1 application topically daily.   famotidine 20 MG tablet Commonly known as: PEPCID Take 20 mg by mouth 3 (three) times a week. Monday, Wednesday, Friday   fluticasone 50 MCG/ACT nasal spray Commonly known as: FLONASE Place 2 sprays into both nostrils daily.   gabapentin 100 MG capsule Commonly known as:  NEURONTIN Take 100 mg by mouth daily.   hydrALAZINE 25 MG tablet Commonly known as: APRESOLINE Take 25 mg by mouth 3 (three) times daily.   insulin aspart 100 UNIT/ML FlexPen Commonly known as: NOVOLOG Inject 2-12 Units into the skin 3 (three) times daily with meals. Sliding Scale: BG = 180-200: 2 units;  BG = 201-250: 5 units;  BG = 251-300: 7 units;  BG = 301-350: 10 units;  BG = 351-400: 12 units.   insulin detemir 100 UNIT/ML injection Commonly known as: LEVEMIR Inject 30 Units into the skin at bedtime.   labetalol 300 MG tablet Commonly known as: NORMODYNE Take 300 mg by mouth 2 (two) times daily.   loperamide 2 MG capsule Commonly known as: IMODIUM Take 2 mg by mouth as needed for diarrhea or loose stools.   nitroGLYCERIN 0.4 MG  SL tablet Commonly known as: NITROSTAT Place 0.4 mg under the tongue every 5 (five) minutes as needed for chest pain.   ondansetron 4 MG tablet Commonly known as: ZOFRAN Take 4 mg by mouth as needed for nausea/vomiting.   pantoprazole 20 MG tablet Commonly known as: PROTONIX Take 20 mg by mouth daily.   polyethylene glycol 17 g packet Commonly known as: MIRALAX / GLYCOLAX Take 17 g by mouth as needed for constipation.   ProAir HFA 108 (90 Base) MCG/ACT inhaler Generic drug: albuterol Inhale 2 puffs into the lungs every 4 (four) hours as needed for wheezing or shortness of breath.   rosuvastatin 5 MG tablet Commonly known as: CRESTOR Take 5 mg by mouth at bedtime.   sevelamer carbonate 800 MG tablet Commonly known as: RENVELA Take 2,400 mg by mouth 3 (three) times daily with meals.   Viibryd 20 MG Tabs Generic drug: Vilazodone HCl Take 20 mg by mouth daily.       Time coordinating discharge: 35 minutes  The results of significant diagnostics from this hospitalization (including imaging, microbiology, ancillary and laboratory) are listed below for reference.    Procedures and Diagnostic Studies:   DG Chest Portable 1  View  Result Date: 06/21/2020 CLINICAL DATA:  Shortness of breath EXAM: PORTABLE CHEST 1 VIEW COMPARISON:  None. FINDINGS: Tracheostomy tube in place. Cardiomegaly with vascular congestion. Bibasilar atelectasis. No effusions or acute bony abnormality. IMPRESSION: Cardiomegaly with vascular congestion and bibasilar atelectasis. Electronically Signed   By: Rolm Baptise M.D.   On: 06/21/2020 02:10     Labs:   Basic Metabolic Panel: Recent Labs  Lab 06/21/20 0225 06/21/20 2315 06/22/20 0354 06/23/20 0226  NA 136 135 135 135  K 5.9* 5.9* 3.5 4.9  CL 102 103 96* 99  CO2 16* 14* 23 22  GLUCOSE 206* 190* 150* 161*  BUN 102* 107* 44* 58*  CREATININE 15.29* 16.52* 9.09* 10.91*  CALCIUM 6.7* 6.5* 7.6* 7.0*  MG 2.2  --   --  2.0  PHOS  --  9.6*  --   --    GFR Estimated Creatinine Clearance: 7.5 mL/min (A) (by C-G formula based on SCr of 10.91 mg/dL (H)). Liver Function Tests: Recent Labs  Lab 06/21/20 2315  ALBUMIN 3.6   No results for input(s): LIPASE, AMYLASE in the last 168 hours. No results for input(s): AMMONIA in the last 168 hours. Coagulation profile No results for input(s): INR, PROTIME in the last 168 hours.  CBC: Recent Labs  Lab 06/21/20 0225 06/21/20 2315 06/22/20 0354 06/23/20 0226  WBC 6.0 6.3 5.2 6.1  NEUTROABS 3.8  --   --   --   HGB 8.0* 7.9* 7.6* 7.2*  HCT 25.7* 25.9* 23.9* 23.2*  MCV 90.8 91.2 87.5 89.9  PLT 102* 99* 102* 106*   Cardiac Enzymes: No results for input(s): CKTOTAL, CKMB, CKMBINDEX, TROPONINI in the last 168 hours. BNP: Invalid input(s): POCBNP CBG: Recent Labs  Lab 06/23/20 0626 06/23/20 1158 06/23/20 1619 06/23/20 2206 06/24/20 0831  GLUCAP 106* 83 171* 248* 203*   D-Dimer No results for input(s): DDIMER in the last 72 hours. Hgb A1c No results for input(s): HGBA1C in the last 72 hours. Lipid Profile No results for input(s): CHOL, HDL, LDLCALC, TRIG, CHOLHDL, LDLDIRECT in the last 72 hours. Thyroid function studies No  results for input(s): TSH, T4TOTAL, T3FREE, THYROIDAB in the last 72 hours.  Invalid input(s): FREET3 Anemia work up No results for input(s): VITAMINB12, FOLATE, FERRITIN, TIBC, IRON, RETICCTPCT  in the last 72 hours. Microbiology Recent Results (from the past 240 hour(s))  Resp Panel by RT-PCR (Flu A&B, Covid) Nasopharyngeal Swab     Status: None   Collection Time: 06/21/20  1:35 AM   Specimen: Nasopharyngeal Swab; Nasopharyngeal(NP) swabs in vial transport medium  Result Value Ref Range Status   SARS Coronavirus 2 by RT PCR NEGATIVE NEGATIVE Final    Comment: (NOTE) SARS-CoV-2 target nucleic acids are NOT DETECTED.  The SARS-CoV-2 RNA is generally detectable in upper respiratory specimens during the acute phase of infection. The lowest concentration of SARS-CoV-2 viral copies this assay can detect is 138 copies/mL. A negative result does not preclude SARS-Cov-2 infection and should not be used as the sole basis for treatment or other patient management decisions. A negative result may occur with  improper specimen collection/handling, submission of specimen other than nasopharyngeal swab, presence of viral mutation(s) within the areas targeted by this assay, and inadequate number of viral copies(<138 copies/mL). A negative result must be combined with clinical observations, patient history, and epidemiological information. The expected result is Negative.  Fact Sheet for Patients:  EntrepreneurPulse.com.au  Fact Sheet for Healthcare Providers:  IncredibleEmployment.be  This test is no t yet approved or cleared by the [place] FDA and  has been authorized for detection and/or diagnosis of SARS-CoV-2 by FDA under an Emergency Use Authorization (EUA). This EUA will remain  in effect (meaning this test can be used) for the duration of the COVID-19 declaration under Section 564(b)(1) of the Act, 21 U.S.C.section 360bbb-3(b)(1), unless the  authorization is terminated  or revoked sooner.       Influenza A by PCR NEGATIVE NEGATIVE Final   Influenza B by PCR NEGATIVE NEGATIVE Final    Comment: (NOTE) The Xpert Xpress SARS-CoV-2/FLU/RSV plus assay is intended as an aid in the diagnosis of influenza from Nasopharyngeal swab specimens and should not be used as a sole basis for treatment. Nasal washings and aspirates are unacceptable for Xpert Xpress SARS-CoV-2/FLU/RSV testing.  Fact Sheet for Patients: EntrepreneurPulse.com.au  Fact Sheet for Healthcare Providers: IncredibleEmployment.be  This test is not yet approved or cleared by the [place] FDA and has been authorized for detection and/or diagnosis of SARS-CoV-2 by FDA under an Emergency Use Authorization (EUA). This EUA will remain in effect (meaning this test can be used) for the duration of the COVID-19 declaration under Section 564(b)(1) of the Act, 21 U.S.C. section 360bbb-3(b)(1), unless the authorization is terminated or revoked.  Performed at [facility], [place] [address] [place], [place] [address]   MRSA PCR Screening     Status: Abnormal   Collection Time: 06/21/20  6:46 PM   Specimen: Nasopharyngeal  Result Value Ref Range Status   MRSA by PCR POSITIVE (A) NEGATIVE Final    Comment:        The GeneXpert MRSA Assay (FDA approved for NASAL specimens only), is one component of a comprehensive MRSA colonization surveillance program. It is not intended to diagnose MRSA infection nor to guide or monitor treatment for MRSA infections. RESULT CALLED TO, READ BACK BY AND VERIFIED WITH: MATOS,Y RN 06/21/2020 AT 2214 SKEEN,P Performed at [facility], [place] [address] [place], [place] [address]   C Difficile Quick Screen w PCR reflex     Status: None   Collection Time: 06/22/20  3:20 AM   Specimen: STOOL  Result Value Ref Range Status   C Diff antigen NEGATIVE NEGATIVE Final   C Diff toxin NEGATIVE  NEGATIVE Final   C Diff interpretation No C. difficile detected.  Final    Comment: Performed at [facility], [place] [address], [place], [place] [address]     Signed: Terrilee Croak  [facility] 06/24/2020, 10:07 AM

## 2020-06-24 NOTE — TOC Progression Note (Signed)
Transition of Care [facility]) - Progression Note    Patient Details  Name: Venie Montesinos MRN: [id number] Date of Birth: September 10, 1970  [facility] CM/SW Contact  Joanne Chars, [address] Phone Number: 06/24/2020, 3:08 PM  Clinical Narrative:   CSW has made efforts to set up medicaid transportation to dialysis starting at [place] was directed to [facility] [employer] transportation by [facility], CSW was initially given the incorrect phone number for [employer], obtained the correct number by googling and was then told [employer] does not accept medicaid. CSW was on hold with [employer] for one hour while they attempted to contact [employer], was eventually told that no one was available and that they would call CSW back (this was at 12noon)  CSW was given contact at [employer]: [place], [phone], called the number and LM. Also attempted to contact by email, which did not go through.     Expected Discharge Plan: Home/Self Care Barriers to Discharge: Other (comment) (dialysis transport)  Expected Discharge Plan and Services Expected Discharge Plan: Home/Self Care     Post Acute Care Choice: Dialysis Living arrangements for the past 2 months: Apartment Expected Discharge Date: 06/24/20               DME Arranged: Oxygen DME Agency: [employer] Date DME Agency Contacted: 06/24/20 Time DME Agency Contacted: 39 Representative spoke with at DME Agency: [place]: NA           Social Determinants of Health ([place]) Interventions    Readmission Risk Interventions No flowsheet data found.

## 2020-06-24 NOTE — Progress Notes (Signed)
Renal Navigator updated patient's outpatient HD clinic/[facility] of report of homelessness and her conversation with TOC CSW. Navigator provided clinic with new address where patient will be staying with her aunt temporarily and aunt's phone number. Navigator requests clinic Social Worker follow up with patient.  Patient's outpatient HD schedule is MWF 12:00pm.  Alphonzo Cruise, [place] Renal Navigator [phone]

## 2020-06-24 NOTE — Progress Notes (Signed)
TOC CSW reports issues getting in touch with [facility] for transportation. Navigator contacted patient's outpatient HD clinic to see how she has been getting to HD prior to hospitalization to see if we can resume services for next outpatient HD appointment Friday. Clinic Social worker/Carmen is contacting transportation company in [place] and states she will call Navigator back.  Alphonzo Cruise, [place] Renal Navigator [phone]

## 2020-06-24 NOTE — Progress Notes (Signed)
SATURATION QUALIFICATIONS:  Patient Saturations on Room Air at Rest = 97%  Patient Saturations on Room Air while Ambulating = 82%  Patient Saturations on 3 Liters of oxygen while Ambulating = 94%  While ambulating patient becomes short of breath on room air after 10 steps.  Placed on 3 liters she seems a lot more comfortable.

## 2020-06-24 NOTE — TOC Transition Note (Signed)
Transition of Care [facility]) - CM/SW Discharge Note   Patient Details  Name: Avyana Puffenbarger MRN: [id number] Date of Birth: 1970-11-16  [facility] CM/SW Contact:  Joanne Chars, LCSW Phone Number: 06/24/2020, 4:13 PM   Clinical Narrative:   Pt discharging home to self care, follow up with dialysis at [facility].  [employer] transportation arranged.  O2 in place through [employer].  Pt will transport home with [facility] due to trach/O2 needs, discussed with nursing.  Pt reports she has contacted her aunt Jeani Hawking and she is expecting her.     Final next level of care: Home/Self Care Barriers to Discharge: Other (comment) (dialysis transport)   Patient Goals and CMS Choice        Discharge Placement                       Discharge Plan and Services     Post Acute Care Choice: Dialysis          DME Arranged: Oxygen DME Agency: [employer] Date DME Agency Contacted: 06/24/20 Time DME Agency Contacted: 73 Representative spoke with at DME Agency: [place]: NA [place] Agency: NA        Social Determinants of Health ([place]) Interventions     Readmission Risk Interventions No flowsheet data found.

## 2020-06-24 NOTE — TOC Initial Note (Signed)
Transition of Care [facility]) - Initial/Assessment Note    Patient Details  Name: Pamela Rogers MRN: [id number] Date of Birth: 24-Aug-1970  Transition of Care [facility]) CM/SW Contact:    Joanne Chars, LCSW Phone Number: 06/24/2020, 11:29 AM  Clinical Narrative:  Please see note from 12/14.                 Expected Discharge Plan: Home/Self Care Barriers to Discharge: Other (comment) (dialysis transport)   Patient Goals and CMS Choice        Expected Discharge Plan and Services Expected Discharge Plan: Home/Self Care     Post Acute Care Choice: Dialysis Living arrangements for the past 2 months: Apartment Expected Discharge Date: 06/24/20               DME Arranged: Oxygen DME Agency: [employer] Date DME Agency Contacted: 06/24/20 Time DME Agency Contacted: 29 Representative spoke with at DME Agency: [place]: NA          Prior Living Arrangements/Services Living arrangements for the past 2 months: Apartment Lives with:: Adult Children Patient language and need for interpreter reviewed:: Yes Do you feel safe going back to the place where you live?: No   Pt daughter is not allowing pt to return to her home.  Pt will be staying with her aunt.  Need for Family Participation in Patient Care: Yes (Comment) Care giver support system in place?: No (comment) (aunt is new support, does not have good support) Current home services: Other (comment) (none) Criminal Activity/Legal Involvement Pertinent to Current Situation/Hospitalization: No - Comment as needed  Activities of Daily Living Home Assistive Devices/Equipment: Eyeglasses ADL Screening (condition at time of admission) Patient's cognitive ability adequate to safely complete daily activities?: Yes Is the patient deaf or have difficulty hearing?: No Does the patient have difficulty seeing, even when wearing glasses/contacts?: Yes Does the patient have difficulty concentrating, remembering, or making  decisions?: Yes Patient able to express need for assistance with ADLs?: Yes Does the patient have difficulty dressing or bathing?: No Independently performs ADLs?: No Communication: Independent Dressing (OT): Independent Grooming: Needs assistance Is this a change from baseline?: Pre-admission baseline Feeding: Independent Bathing: Needs assistance Is this a change from baseline?: Pre-admission baseline Toileting: Needs assistance Is this a change from baseline?: Pre-admission baseline In/Out Bed: Needs assistance Is this a change from baseline?: Pre-admission baseline Walks in Home: Independent Does the patient have difficulty walking or climbing stairs?: Yes Weakness of Legs: Both Weakness of Arms/Hands: None  Permission Sought/Granted Permission sought to share information with : Family Supports Permission granted to share information with : Yes, Verbal Permission Granted  Share Information with NAME: daughter Lesly Rubenstein, aunt Biochemist, clinical           Emotional Assessment Appearance:: Appears stated age Attitude/Demeanor/Rapport: Engaged Affect (typically observed): Appropriate Orientation: : Oriented to Self,Oriented to Place,Oriented to  Time,Oriented to Situation Alcohol / Substance Use: Not Applicable Psych Involvement: No (comment)  Admission diagnosis:  Hyperkalemia [E87.5] Primary hypertension [I10] SOB (shortness of breath) [R06.02] Non-compliance with renal dialysis ([place]) [Z91.15] Volume overload [E87.70] Patient Active Problem List   Diagnosis Date Noted  . Non-compliance with renal dialysis ([place]) 06/21/2020  . DM2 (diabetes mellitus, type 2) ([place]) 06/21/2020  . HTN (hypertension) 06/21/2020  . ESRD (end stage renal disease) ([place]) 06/21/2020  . Volume overload 06/21/2020  . Tracheostomy status ([place]) 06/21/2020   PCP:  Patient, No Pcp Per Pharmacy:   [facility], [place]  [address] [place] [address] Phone: [phone] Fax: [phone]     Social Determinants of Health (SDOH) Interventions    Readmission Risk Interventions No flowsheet data found.

## 2020-06-25 NOTE — Progress Notes (Signed)
Discharge notes faxed to patient's outpatient HD clinic to provide continuity of care.  Alphonzo Cruise, [place] Renal Navigator [phone]

## 2021-12-30 IMAGING — DX DG CHEST 1V PORT
1 series · 1 of 1 positions shown · non-contrast
Comparison: None.

CLINICAL DATA: Shortness of breath

EXAM:
PORTABLE CHEST 1 VIEW

[chest]
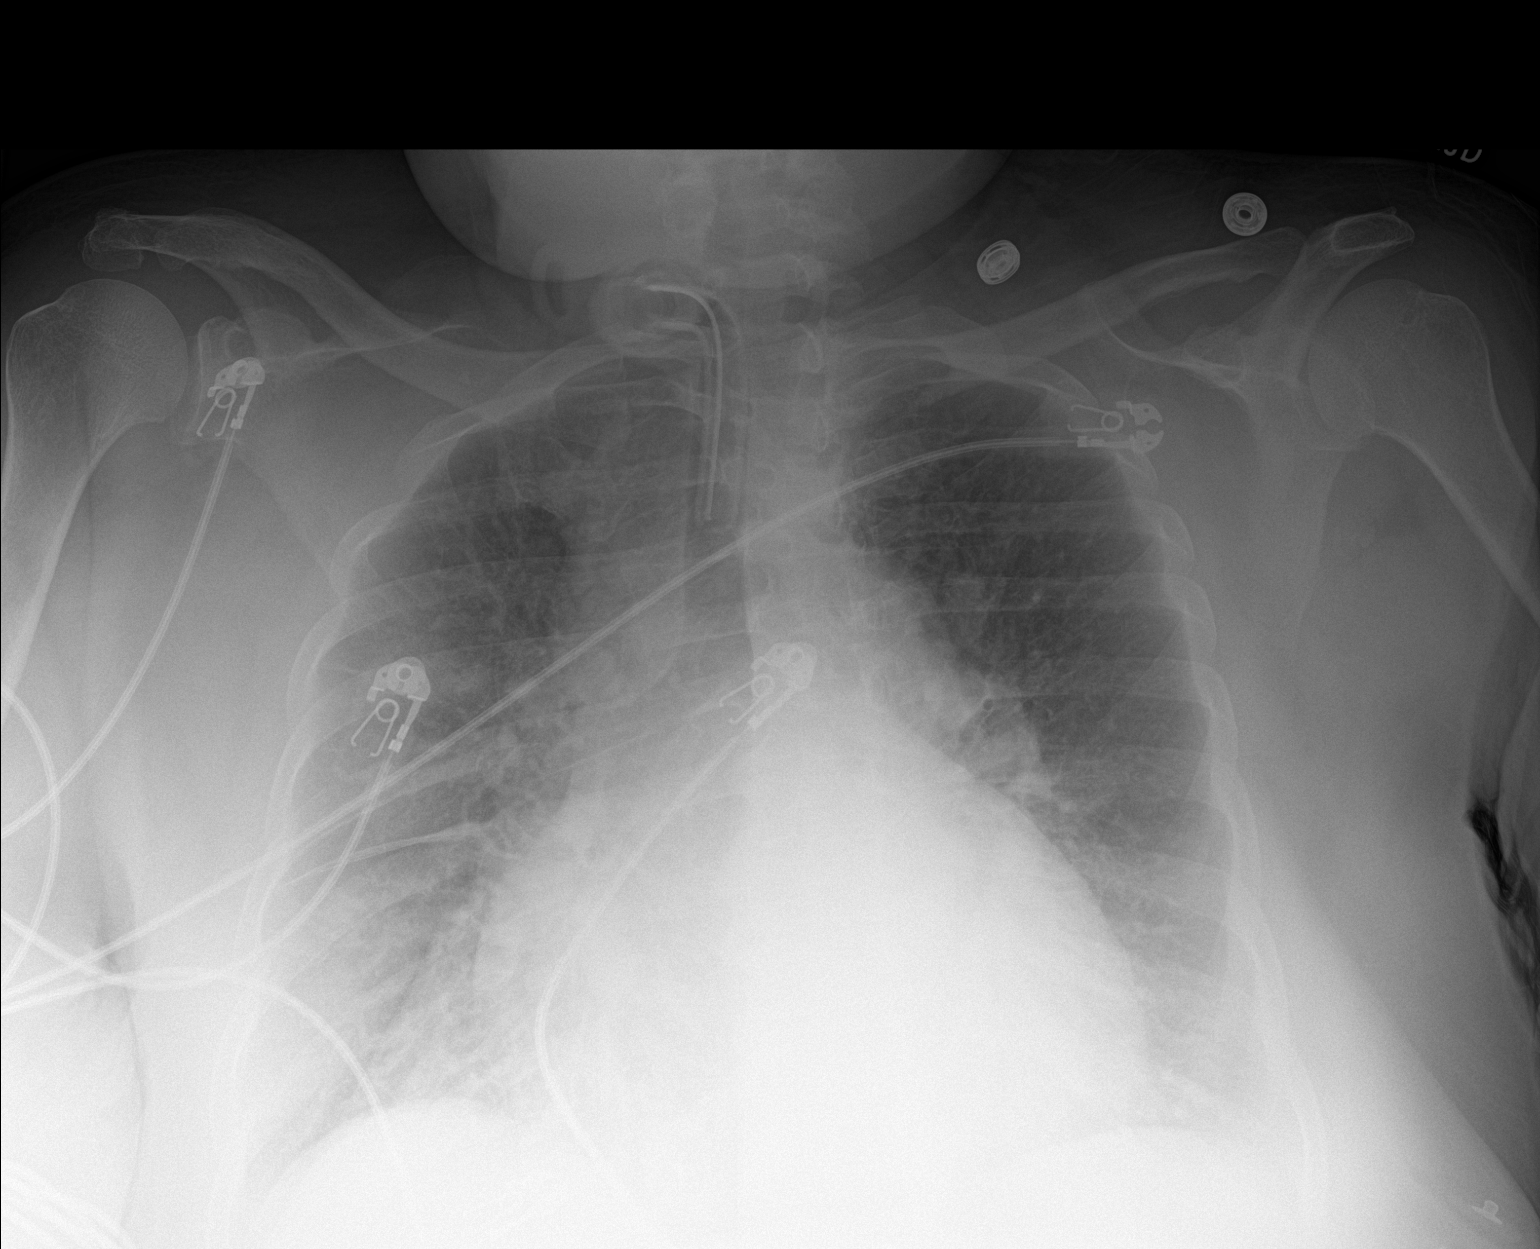

[1 of 1 positions shown; findings below may reference images not displayed]

FINDINGS: Tracheostomy tube in place. Cardiomegaly with vascular congestion.
Bibasilar atelectasis. No effusions or acute bony abnormality.
IMPRESSION: Cardiomegaly with vascular congestion and bibasilar atelectasis.
